# Patient Record
Sex: Female | Born: 1975 | Race: White | Hispanic: No | Marital: Single | Smoking: Former smoker
Health system: Southern US, Community
[De-identification: ages and names within clinical notes are randomized; demographics above are authoritative.]

## PROBLEM LIST (undated history)

## (undated) DIAGNOSIS — F419 Anxiety disorder, unspecified: Secondary | ICD-10-CM

## (undated) DIAGNOSIS — R971 Elevated cancer antigen 125 [CA 125]: Secondary | ICD-10-CM

## (undated) DIAGNOSIS — B009 Herpesviral infection, unspecified: Secondary | ICD-10-CM

## (undated) DIAGNOSIS — I2699 Other pulmonary embolism without acute cor pulmonale: Secondary | ICD-10-CM

## (undated) DIAGNOSIS — J45909 Unspecified asthma, uncomplicated: Secondary | ICD-10-CM

## (undated) DIAGNOSIS — R87629 Unspecified abnormal cytological findings in specimens from vagina: Secondary | ICD-10-CM

## (undated) DIAGNOSIS — N939 Abnormal uterine and vaginal bleeding, unspecified: Secondary | ICD-10-CM

## (undated) DIAGNOSIS — N941 Unspecified dyspareunia: Secondary | ICD-10-CM

## (undated) HISTORY — PX: COSMETIC SURGERY: SHX468

## (undated) HISTORY — PX: OTHER SURGICAL HISTORY: SHX169

---

## 1998-02-03 ENCOUNTER — Other Ambulatory Visit: Admission: RE | Admit: 1998-02-03 | Discharge: 1998-02-03 | Payer: Self-pay | Admitting: Obstetrics & Gynecology

## 1998-06-18 ENCOUNTER — Other Ambulatory Visit: Admission: RE | Admit: 1998-06-18 | Discharge: 1998-06-18 | Payer: Self-pay | Admitting: Obstetrics & Gynecology

## 1998-09-24 ENCOUNTER — Other Ambulatory Visit: Admission: RE | Admit: 1998-09-24 | Discharge: 1998-09-24 | Payer: Self-pay | Admitting: Obstetrics & Gynecology

## 1999-05-17 ENCOUNTER — Other Ambulatory Visit: Admission: RE | Admit: 1999-05-17 | Discharge: 1999-05-17 | Payer: Self-pay | Admitting: Obstetrics & Gynecology

## 2000-06-15 ENCOUNTER — Other Ambulatory Visit: Admission: RE | Admit: 2000-06-15 | Discharge: 2000-06-15 | Payer: Self-pay | Admitting: Obstetrics & Gynecology

## 2000-10-07 ENCOUNTER — Inpatient Hospital Stay (HOSPITAL_COMMUNITY): Admission: AD | Admit: 2000-10-07 | Discharge: 2000-10-07 | Payer: Self-pay | Admitting: Obstetrics and Gynecology

## 2000-10-09 ENCOUNTER — Encounter (INDEPENDENT_AMBULATORY_CARE_PROVIDER_SITE_OTHER): Payer: Self-pay

## 2000-10-09 ENCOUNTER — Ambulatory Visit (HOSPITAL_COMMUNITY): Admission: EM | Admit: 2000-10-09 | Discharge: 2000-10-09 | Payer: Self-pay | Admitting: Obstetrics & Gynecology

## 2001-01-25 ENCOUNTER — Ambulatory Visit (HOSPITAL_COMMUNITY): Admission: RE | Admit: 2001-01-25 | Discharge: 2001-01-25 | Payer: Self-pay | Admitting: Obstetrics & Gynecology

## 2001-01-25 ENCOUNTER — Encounter (INDEPENDENT_AMBULATORY_CARE_PROVIDER_SITE_OTHER): Payer: Self-pay

## 2001-10-04 ENCOUNTER — Other Ambulatory Visit: Admission: RE | Admit: 2001-10-04 | Discharge: 2001-10-04 | Payer: Self-pay | Admitting: Obstetrics & Gynecology

## 2002-04-21 ENCOUNTER — Inpatient Hospital Stay (HOSPITAL_COMMUNITY): Admission: AD | Admit: 2002-04-21 | Discharge: 2002-04-21 | Payer: Self-pay | Admitting: Obstetrics & Gynecology

## 2002-04-22 ENCOUNTER — Inpatient Hospital Stay (HOSPITAL_COMMUNITY): Admission: AD | Admit: 2002-04-22 | Discharge: 2002-04-25 | Payer: Self-pay | Admitting: Obstetrics and Gynecology

## 2002-04-24 ENCOUNTER — Encounter: Payer: Self-pay | Admitting: Obstetrics & Gynecology

## 2002-05-26 ENCOUNTER — Inpatient Hospital Stay (HOSPITAL_COMMUNITY): Admission: AD | Admit: 2002-05-26 | Discharge: 2002-05-29 | Payer: Self-pay | Admitting: Obstetrics & Gynecology

## 2003-03-31 ENCOUNTER — Other Ambulatory Visit: Admission: RE | Admit: 2003-03-31 | Discharge: 2003-03-31 | Payer: Self-pay | Admitting: Obstetrics & Gynecology

## 2004-04-13 ENCOUNTER — Other Ambulatory Visit: Admission: RE | Admit: 2004-04-13 | Discharge: 2004-04-13 | Payer: Self-pay | Admitting: Obstetrics & Gynecology

## 2005-05-16 ENCOUNTER — Other Ambulatory Visit: Admission: RE | Admit: 2005-05-16 | Discharge: 2005-05-16 | Payer: Self-pay | Admitting: Obstetrics & Gynecology

## 2008-09-16 ENCOUNTER — Ambulatory Visit: Payer: Self-pay | Admitting: Family Medicine

## 2008-09-16 DIAGNOSIS — J189 Pneumonia, unspecified organism: Secondary | ICD-10-CM | POA: Insufficient documentation

## 2008-09-16 DIAGNOSIS — R0602 Shortness of breath: Secondary | ICD-10-CM | POA: Insufficient documentation

## 2008-09-16 LAB — CONVERTED CEMR LAB: Hemoglobin: 14.2 g/dL

## 2008-09-24 ENCOUNTER — Ambulatory Visit: Payer: Self-pay | Admitting: Family Medicine

## 2008-09-24 ENCOUNTER — Telehealth: Payer: Self-pay | Admitting: Family Medicine

## 2008-09-24 DIAGNOSIS — J018 Other acute sinusitis: Secondary | ICD-10-CM | POA: Insufficient documentation

## 2008-09-25 ENCOUNTER — Encounter: Payer: Self-pay | Admitting: Family Medicine

## 2008-09-25 LAB — CONVERTED CEMR LAB
Basophils Absolute: 0.1 10*3/uL (ref 0.0–0.1)
Basophils Relative: 1 % (ref 0–1)
Eosinophils Absolute: 0.1 10*3/uL (ref 0.0–0.7)
Eosinophils Relative: 2 % (ref 0–5)
HCT: 40.6 % (ref 36.0–46.0)
Hemoglobin: 14 g/dL (ref 12.0–15.0)
Lymphocytes Relative: 25 % (ref 12–46)
Lymphs Abs: 1.9 10*3/uL (ref 0.7–4.0)
MCHC: 34.5 g/dL (ref 30.0–36.0)
MCV: 90.2 fL (ref 78.0–100.0)
Monocytes Absolute: 0.9 10*3/uL (ref 0.1–1.0)
Monocytes Relative: 11 % (ref 3–12)
Neutro Abs: 4.7 10*3/uL (ref 1.7–7.7)
Neutrophils Relative %: 62 % (ref 43–77)
Platelets: 214 10*3/uL (ref 150–400)
RBC: 4.5 M/uL (ref 3.87–5.11)
RDW: 13.1 % (ref 11.5–15.5)
WBC: 7.6 10*3/uL (ref 4.0–10.5)

## 2008-10-29 ENCOUNTER — Encounter: Payer: Self-pay | Admitting: Family Medicine

## 2008-10-29 DIAGNOSIS — I2699 Other pulmonary embolism without acute cor pulmonale: Secondary | ICD-10-CM | POA: Insufficient documentation

## 2008-10-31 ENCOUNTER — Encounter: Payer: Self-pay | Admitting: Family Medicine

## 2008-11-01 ENCOUNTER — Encounter: Payer: Self-pay | Admitting: Family Medicine

## 2008-11-02 ENCOUNTER — Encounter: Payer: Self-pay | Admitting: Family Medicine

## 2008-11-02 ENCOUNTER — Telehealth (INDEPENDENT_AMBULATORY_CARE_PROVIDER_SITE_OTHER): Payer: Self-pay | Admitting: *Deleted

## 2008-11-04 ENCOUNTER — Ambulatory Visit: Payer: Self-pay | Admitting: Family Medicine

## 2008-11-04 DIAGNOSIS — R091 Pleurisy: Secondary | ICD-10-CM | POA: Insufficient documentation

## 2008-11-04 LAB — CONVERTED CEMR LAB
INR: 2.2
Prothrombin Time: 18.2 s

## 2008-11-10 ENCOUNTER — Ambulatory Visit: Payer: Self-pay | Admitting: Family Medicine

## 2008-11-10 LAB — CONVERTED CEMR LAB
INR: 2.2
Prothrombin Time: 18.1 s

## 2008-11-11 ENCOUNTER — Ambulatory Visit: Payer: Self-pay | Admitting: Family Medicine

## 2008-11-12 ENCOUNTER — Telehealth: Payer: Self-pay | Admitting: Family Medicine

## 2008-11-24 ENCOUNTER — Ambulatory Visit: Payer: Self-pay | Admitting: Family Medicine

## 2008-11-24 LAB — CONVERTED CEMR LAB
INR: 2
Prothrombin Time: 17.5 s

## 2008-11-25 ENCOUNTER — Telehealth: Payer: Self-pay | Admitting: Family Medicine

## 2008-11-25 ENCOUNTER — Encounter: Payer: Self-pay | Admitting: Family Medicine

## 2008-12-08 ENCOUNTER — Encounter: Payer: Self-pay | Admitting: Family Medicine

## 2008-12-08 ENCOUNTER — Ambulatory Visit: Payer: Self-pay | Admitting: Family Medicine

## 2008-12-08 DIAGNOSIS — R5383 Other fatigue: Secondary | ICD-10-CM

## 2008-12-08 DIAGNOSIS — R209 Unspecified disturbances of skin sensation: Secondary | ICD-10-CM | POA: Insufficient documentation

## 2008-12-08 DIAGNOSIS — R5381 Other malaise: Secondary | ICD-10-CM | POA: Insufficient documentation

## 2008-12-08 LAB — CONVERTED CEMR LAB
INR: 2.1
INR: 2.1 — ABNORMAL HIGH (ref 0.0–1.5)
Prothrombin Time: 18.3 s — ABNORMAL HIGH (ref 10.2–14.0)

## 2008-12-09 LAB — CONVERTED CEMR LAB
HCT: 42.2 % (ref 36.0–46.0)
Hemoglobin: 14 g/dL (ref 12.0–15.0)
Iron: 119 ug/dL (ref 42–145)
MCHC: 33.2 g/dL (ref 30.0–36.0)
MCV: 89.6 fL (ref 78.0–100.0)
Platelets: 221 10*3/uL (ref 150–400)
RBC: 4.71 M/uL (ref 3.87–5.11)
RDW: 12.8 % (ref 11.5–15.5)
Saturation Ratios: 30 % (ref 20–55)
TIBC: 391 ug/dL (ref 250–470)
TSH: 0.969 microintl units/mL (ref 0.350–4.50)
UIBC: 272 ug/dL
WBC: 7.3 10*3/uL (ref 4.0–10.5)

## 2008-12-21 ENCOUNTER — Ambulatory Visit: Payer: Self-pay | Admitting: Family Medicine

## 2008-12-21 DIAGNOSIS — J0100 Acute maxillary sinusitis, unspecified: Secondary | ICD-10-CM | POA: Insufficient documentation

## 2008-12-22 ENCOUNTER — Telehealth: Payer: Self-pay | Admitting: Family Medicine

## 2009-01-01 ENCOUNTER — Ambulatory Visit: Payer: Self-pay | Admitting: Family Medicine

## 2009-01-01 LAB — CONVERTED CEMR LAB
INR: 1.5
Prothrombin Time: 15.2 s

## 2009-01-14 ENCOUNTER — Ambulatory Visit: Payer: Self-pay | Admitting: Family Medicine

## 2009-01-14 LAB — CONVERTED CEMR LAB
INR: 1.7
Prothrombin Time: 15.9 s

## 2009-01-21 ENCOUNTER — Ambulatory Visit: Payer: Self-pay | Admitting: Family Medicine

## 2009-01-21 ENCOUNTER — Encounter: Admission: RE | Admit: 2009-01-21 | Discharge: 2009-01-21 | Payer: Self-pay | Admitting: Family Medicine

## 2009-01-21 LAB — CONVERTED CEMR LAB
INR: 1.9
Prothrombin Time: 16.8 s

## 2009-01-27 ENCOUNTER — Ambulatory Visit: Payer: Self-pay | Admitting: Critical Care Medicine

## 2009-01-31 ENCOUNTER — Encounter: Payer: Self-pay | Admitting: Critical Care Medicine

## 2009-02-11 ENCOUNTER — Ambulatory Visit: Payer: Self-pay | Admitting: Family Medicine

## 2009-02-11 LAB — CONVERTED CEMR LAB
INR: 2.1
Prothrombin Time: 17.8 s

## 2009-03-11 ENCOUNTER — Ambulatory Visit: Payer: Self-pay | Admitting: Family Medicine

## 2009-03-11 LAB — CONVERTED CEMR LAB
INR: 1.8
Prothrombin Time: 16.4 s

## 2009-04-08 ENCOUNTER — Ambulatory Visit: Payer: Self-pay | Admitting: Family Medicine

## 2009-04-08 LAB — CONVERTED CEMR LAB
INR: 1.5
Prothrombin Time: 15.2 s

## 2009-04-19 ENCOUNTER — Ambulatory Visit: Payer: Self-pay | Admitting: Family Medicine

## 2009-05-05 ENCOUNTER — Ambulatory Visit: Payer: Self-pay | Admitting: Critical Care Medicine

## 2009-05-06 ENCOUNTER — Ambulatory Visit: Payer: Self-pay | Admitting: Family Medicine

## 2009-05-06 LAB — CONVERTED CEMR LAB
INR: 1.3
Prothrombin Time: 13.4 s

## 2009-05-07 ENCOUNTER — Encounter: Payer: Self-pay | Admitting: Critical Care Medicine

## 2009-05-17 ENCOUNTER — Ambulatory Visit: Payer: Self-pay | Admitting: Family Medicine

## 2009-05-17 LAB — CONVERTED CEMR LAB
INR: 2
Prothrombin Time: 14.2 s

## 2009-06-18 ENCOUNTER — Ambulatory Visit: Payer: Self-pay | Admitting: Family Medicine

## 2009-06-18 LAB — CONVERTED CEMR LAB
INR: 3.1
INR: 3.1 — ABNORMAL HIGH (ref 0.0–1.5)
Prothrombin Time: 24.2 s
Prothrombin Time: 24.2 s — ABNORMAL HIGH (ref 10.2–14.0)

## 2009-07-21 ENCOUNTER — Encounter (INDEPENDENT_AMBULATORY_CARE_PROVIDER_SITE_OTHER): Payer: Self-pay | Admitting: *Deleted

## 2009-07-22 ENCOUNTER — Encounter: Payer: Self-pay | Admitting: Family Medicine

## 2009-07-23 LAB — CONVERTED CEMR LAB
INR: 1.6
INR: 1.6 — ABNORMAL HIGH (ref 0.0–1.5)
Prothrombin Time: 16 s
Prothrombin Time: 16 s — ABNORMAL HIGH (ref 10.2–14.0)

## 2009-07-27 ENCOUNTER — Ambulatory Visit: Payer: Self-pay | Admitting: Family Medicine

## 2009-07-27 DIAGNOSIS — J309 Allergic rhinitis, unspecified: Secondary | ICD-10-CM | POA: Insufficient documentation

## 2009-08-12 ENCOUNTER — Ambulatory Visit: Payer: Self-pay | Admitting: Critical Care Medicine

## 2009-08-18 LAB — CONVERTED CEMR LAB
AntiThromb III Func: 111 % (ref 76–126)
Anticardiolipin IgA: <3 (ref <10)
Anticardiolipin IgG: 6 (ref <10)
Anticardiolipin IgM: 4 (ref <10)
Homocysteine: 5.5 micromoles/L (ref 4.0–15.4)
Protein S Ag, Total: 74 % (ref 70–140)

## 2009-11-02 ENCOUNTER — Telehealth: Payer: Self-pay | Admitting: Family Medicine

## 2010-11-02 ENCOUNTER — Encounter: Payer: Self-pay | Admitting: Family Medicine

## 2010-11-02 ENCOUNTER — Ambulatory Visit
Admission: RE | Admit: 2010-11-02 | Discharge: 2010-11-02 | Payer: Self-pay | Source: Home / Self Care | Attending: Family Medicine | Admitting: Family Medicine

## 2010-11-02 DIAGNOSIS — J069 Acute upper respiratory infection, unspecified: Secondary | ICD-10-CM | POA: Insufficient documentation

## 2010-11-02 LAB — CONVERTED CEMR LAB
HCT: 40.7 % (ref 36.0–46.0)
Hemoglobin: 14.1 g/dL (ref 12.0–15.0)
Lymphocytes Relative: 27 % (ref 12–46)
Lymphs Abs: 2.4 10*3/uL (ref 0.7–4.0)
MCHC: 34.7 g/dL (ref 30.0–36.0)
MCV: 92.8 fL (ref 78.0–100.0)
Monocytes Absolute: 0.5 10*3/uL (ref 0.1–1.0)
Monocytes Relative: 6 % (ref 3–12)
Neutro Abs: 5.9 10*3/uL (ref 1.7–7.7)
Neutrophils Relative %: 67 % (ref 43–77)
Platelets: 213 10*3/uL (ref 150–400)
RBC: 4.39 M/uL (ref 3.87–5.11)
RDW: 12.5 % (ref 11.5–15.5)
WBC: 8.8 10*3/uL (ref 4.0–10.5)

## 2010-11-08 NOTE — Letter (Signed)
Summary: Out of Work  [facility]  [address], [address]   [place], [place] [address]   Phone: [phone]  Fax: [phone]    November 04, 2008   Employee:  MAYUMI SUMMERSON    To Whom It May Concern:   For Medical reasons, please excuse the above named employee from work for the following dates:  Start:   Jan 20th, 2010 --> Feb 20th, 2010  End:   Feb 21st, 2010    If you need additional information, please feel free to contact our office.         Sincerely,    Seymour Bars DO

## 2010-11-08 NOTE — Assessment & Plan Note (Signed)
Summary: INR  Nurse Visit   Vitals Entered By: Payton Spark CMA (May 06, 2009 8:36 AM)  Allergies: 1)  ! Pcn Laboratory Results   Blood Tests     PT: 13.4 s   (Normal Range: 10.6-13.4)  INR: 1.3   (Normal Range: 0.88-1.12   Therap INR: 2.0-3.5)    Orders Added: 1)  Fingerstick [36416] 2)  Protime INR [85610] Prescriptions: COUMADIN 6 MG TABS (WARFARIN SODIUM) Sunday - 6 mg, Monday - 6 mg, Tuesday - 7 mg, Wednesday - 6 mg, Thursday - 6 mg, Friday - 7 mg, Saturday - 6 mg  #90 x 0   Entered by:   Michelle Mills CMA   Authorized by:   Karen Bowen DO   Signed by:   Michelle Mills CMA on 05/06/2009   Method used:   Electronically to        [id number]       [address]       [place], [place]  [address]       Ph: [phone] or [phone]       Fax: [phone]   RxID:   [id number] COUMADIN 1 MG TABS (WARFARIN SODIUM) Sunday - 6 mg, Monday - 6 mg, Tuesday - 7 mg, Wednesday - 6 mg, Thursday - 6 mg, Friday - 7 mg, Saturday - 6 mg  #30 x 0   Entered by:   Michelle Mills CMA   Authorized by:   Karen Bowen DO   Signed by:   Michelle Mills CMA on 05/06/2009   Method used:   [facility] to        [id number]       [address]       [place], [place]  [address]       Ph: [phone] or [phone]       Fax: [phone]   RxID:   [id number]    Anticoagulation Management History:      The patient is on coumadin and comes in today for a routine follow up visit.  She is being anticoagulated due to the first episode of deep venous thrombosis and/or pulmonary embolism.  Anticipated length of treatment is 6 months.  Her last INR was 1.5 and today's INR is 1.3.    Anticoagulation Management Assessment/Plan:      The target INR is 2.0-3.0.  She is to have a PT/INR in 2 weeks.  Anticoagulation instructions were given to patient.         Current Anticoagulation Instructions: The patient's dosage of coumadin will be  increased.  The new dosage includes:   Coumadin 6 mg tabs and Coumadin 1 mg tabs:  Sunday - 6 mg, Monday - 8 mg, Tuesday - 6 mg, Wednesday - 8 mg, Thursday - 6 mg, Friday - 8 mg, Saturday - 6 mg.    Repeat PT/INR in 2 weeks.

## 2010-11-08 NOTE — Assessment & Plan Note (Signed)
Summary: sinusitis   Vital Signs:  Patient profile:   35 year old female Weight:      124 pounds O2 Sat:      100 % Temp:     97.7 degrees F oral Pulse rate:   66 / minute BP sitting:   110 / 71  (left arm) Cuff size:   regular  Vitals Entered By: Harlene Salts (December 21, 2008 1:09 PM)  History of Present Illness: c/o thick green congestion,pain in lung area  Taking generic claritin.  Began about 2 wks ago with sneezing.  She has sore throat and post nasal drip.  Just started having dry cough yesteday.  Feels weak and tired.  No energy.  No GI upset.  No fever or chills.  Green/ yellow rhinorrhea.  Also tried Mucinex DM.  Did not help.  Taking Tylenol for HAs.  No hx of allergies.  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  Having increased in pleuritic CP.  Current Medications (verified): 1)  Vitamin C-Acerola 500 Mg Chew (Ascorbic Acid) .... Take 1 Tablet By Mouth Once A Day 2)  Promethazine Hcl 25 Mg Tabs (Promethazine Hcl) .... Take 1 Tablet By Mouth Once A Day As Needed Nausea 3)  Lorazepam 0.5 Mg Tabs (Lorazepam) .Marland Kitchen.. 1 Tab By Mouth Two Times A Day As Needed Anxiety 4)  Coumadin 5 Mg Tabs (Warfarin Sodium) .... Sunday - 5 Mg, Monday - 5 Mg, Tuesday - 5 Mg, Wednesday - 5 Mg, Thursday - 5 Mg, Friday - 5 Mg, Saturday - 5 Mg 5)  Mucinex Dm 30-600 Mg Xr12h-Tab (Dextromethorphan-Guaifenesin) .... Take 1 Tablet By Mouth Once A Day 6)  Claritin 10 Mg Tabs (Loratadine) .... Take 1 Tablet By Mouth Once A Day 7)  Omnicef 300 Mg Caps (Cefdinir) .Marland Kitchen.. 1 Tab By Mouth Q 12 Hrs X 7 Days  Allergies: 1)  ! Pcn  Social History:    Reviewed history from 09/16/2008 and no changes required:       Investment banker, corporate        HS degree.       Divorced and shares custody.       Has 2 daughters 60 & 6       Lives w/ fiance.       Regular exercise.       Has Mirena IUD  Review of Systems      See HPI  Physical Exam  General:  alert, well-developed, well-nourished, and well-hydrated.   Head:   normocephalic and atraumatic.  maxillary sinuses TTP Eyes:  conjunctiva clear Ears:  ears full of soft yellow cerumen Nose:  nasal congestion with boggy turbinates Mouth:  throat injected with clear post nasal drip Neck:  no masses.   Lungs:  Normal respiratory effort, chest expands symmetrically. Lungs are clear to auscultation, no crackles or wheezes. no splinting Heart:  Normal rate and regular rhythm. S1 and S2 normal without gallop, murmur, click, rub or other extra sounds. Skin:  color normal.   Cervical Nodes:  No lymphadenopathy noted   Impression & Recommendations:  Problem # 1:  ACUTE MAXILLARY SINUSITIS (ICD-461.0) 2 wks of illness.  Viral URI vs sinusitis.  Cover with Omnicef x 7 days.  OK to use Mucinex D or DM. Her updated medication list for this problem includes:    Mucinex Dm 30-600 Mg Xr12h-tab (Dextromethorphan-guaifenesin) .Marland Kitchen... Take 1 tablet by mouth once a day    Omnicef 300 Mg Caps (Cefdinir) .Marland Kitchen... 1 tab by mouth q  12 hrs x 7 days  Problem # 2:  PLEURISY (ICD-511.0) Secondary to PE.  Anticoagulants managed here.  INR therapeutic.  Tylenol/ Heat OK.  Pulse ox 100% with a normal lung exam.  Viral URI with cough likely exacerbating her pleurisy.  Complete Medication List: 1)  Vitamin C-acerola 500 Mg Chew (Ascorbic acid) .... Take 1 tablet by mouth once a day 2)  Promethazine Hcl 25 Mg Tabs (Promethazine hcl) .... Take 1 tablet by mouth once a day as needed nausea 3)  Lorazepam 0.5 Mg Tabs (Lorazepam) .Marland Kitchen.. 1 tab by mouth two times a day as needed anxiety 4)  Coumadin 5 Mg Tabs (Warfarin sodium) .... Sunday - 5 mg, monday - 5 mg, tuesday - 5 mg, wednesday - 5 mg, thursday - 5 mg, friday - 5 mg, saturday - 5 mg 5)  Mucinex Dm 30-600 Mg Xr12h-tab (Dextromethorphan-guaifenesin) .... Take 1 tablet by mouth once a day 6)  Claritin 10 Mg Tabs (Loratadine) .... Take 1 tablet by mouth once a day 7)  Omnicef 300 Mg Caps (Cefdinir) .... 1 tab by mouth q 12 hrs x 7  days  Patient Instructions: 1)  OK to add: 2)  Multivitamin daily. 3)  Vitamin C 500 mg daily. 4)  Echinacea daily (optional). 5)  Rapid Strep: negative. 6)  Treat with 7 days of Omnicef for sinusitis. 7)  Safe to use Mucinex D for head congesiton. 8)  The medication list was reviewed and reconciled.  All changed / newly prescribed medications were explained.  A complete medication list was provided to the patient / caregiver. Prescriptions: OMNICEF 300 MG CAPS (CEFDINIR) 1 tab by mouth q 12 hrs x 7 days  #14 x 0   Entered and Authorized by:   Johannes Everage DO   Signed by:   Angellynn Kimberlin DO on 12/21/2008   Method used:   [facility]        [facility]       [address]       [place], [place]  [address]       Ph: [phone] or [phone]       Fax: [phone]   RxID:   [id number]

## 2010-11-08 NOTE — Miscellaneous (Signed)
Summary: PROTIME/INR   Clinical Lists Changes  Orders: Added new Test order of T-Protime, Auto [phone]) - Signed

## 2010-11-08 NOTE — Assessment & Plan Note (Signed)
Summary: tingling   Vital Signs:  Patient Profile:   35 Years Old Female Height:     63.75 inches Weight:      123 pounds BMI:     21.36 O2 Sat:      100 % Pulse rate:   65 / minute BP sitting:   114 / 77  (left arm) Cuff size:   regular  Vitals Entered By: Harlene Salts (December 08, 2008 3:15 PM)                 PCP:  Seymour Bars DO  Chief Complaint:  tingling in fingers.  History of Present Illness: 35 yo WF on coumadin for a post op PE in Jan here for tingling in arms and legs x 5 days along with fatigue.  No Sore throat or other URI symptoms.  No GI upset.  Lack of appetitie.  Has been anxious since having the PE and cont to have R thoracic posterior pleuritic chest pain.  Using Vicodin sparingly.  Back at work and upset that she has not been strong enough to exercise yet.  No bruising, gingival bleeding, vaginal bleeding or rectal bleeding.  No HAs, fevers.  Has felt some chills.    Current Allergies: ! PCN  Past Medical History:    Reviewed history from 11/04/2008 and no changes required:       [id number]       PE 10-2008       Nestor Ramp [facility]  Past Surgical History:    LTCS x 1    breast augmentation/ abdominoplasty 12-09   Social History:    Reviewed history from 09/16/2008 and no changes required:       Investment banker, corporate        HS degree.       Divorced and shares custody.       Has 2 daughters 85 & 6       Lives w/ fiance.       Regular exercise.       Has Mirena IUD    Review of Systems      See HPI   Physical Exam  General:     alert, well-developed, well-nourished, and well-hydrated.   Head:     normocephalic and atraumatic.   Eyes:     R conjunctiva mildly injected nasal side Nose:     no nasal discharge.   Mouth:     good dentition and pharynx pink and moist.   Neck:     no masses.   Lungs:     Normal respiratory effort, chest expands symmetrically. Lungs are clear to auscultation, no crackles or wheezes. Heart:     Normal rate and  regular rhythm. S1 and S2 normal without gallop, murmur, click, rub or other extra sounds. Abdomen:     soft, non-tender, normal bowel sounds, no distention, no masses, no guarding, no hepatomegaly, and no splenomegaly.   Msk:     no joint tenderness, no joint swelling, and no joint warmth.   Pulses:     2+ radial pulses Extremities:     no LE edema Skin:     color normal.   Cervical Nodes:     No lymphadenopathy noted Psych:     flat affect.      Impression & Recommendations:  Problem # 1:  FATIGUE (ICD-780.79) Check labs today, esp for anemia and iron deficiency given use of coumadin.  If labs come back normal, I will treat her  for adjustment disorder with depression and anxiety with SSRI and as needed benzo.   Orders: T-Iron Binding Capacity (TIBC) ([id number]) T-Iron [id number]) T-TSH [id number]) T-CBC No Diff ([id number])   Problem # 2:  PARESTHESIA (ICD-782.0) As per #1.  I do not see any sign of organic cause for tingling in all extremities.  BP stable.  Complete Medication List: 1)  Vitamin C-acerola 500 Mg Chew (Ascorbic acid) .... Take 1 tablet by mouth once a day 2)  Promethazine Hcl 25 Mg Tabs (Promethazine hcl) .... Take 1 tablet by mouth once a day as needed nausea 3)  Lorazepam 1 Mg Tabs (Lorazepam) .... Take 1 tablet by mouth three times a day as needed anxiety 4)  Coumadin 5 Mg Tabs (Warfarin sodium) .... Sunday - 5 mg, monday - 5 mg, tuesday - 5 mg, wednesday - 5 mg, thursday - 5 mg, friday - 5 mg, saturday - 5 mg 5)  Citalopram Hydrobromide 10 Mg Tabs (Citalopram hydrobromide) .Marland Kitchen.. 1 tab by mouth daily    Prescriptions: CITALOPRAM HYDROBROMIDE 10 MG TABS (CITALOPRAM HYDROBROMIDE) 1 tab by mouth daily  #30 x 1   Entered and Authorized by:   Seymour Bars DO   Signed by:   Seymour Bars DO on 12/08/2008   Method used:   Print then Give to Patient   RxID:   [id number]

## 2010-11-08 NOTE — Assessment & Plan Note (Signed)
Summary: PT/INR  Nurse Visit   Vitals Entered By: Payton Spark CMA (March 11, 2009 2:58 PM)    Allergies: 1)  ! Pcn  Laboratory Results   Blood Tests     PT: 16.4 s   (Normal Range: 10.6-13.4)  INR: 1.8   (Normal Range: 0.88-1.12   Therap INR: 2.0-3.5)      Orders Added: 1)  Fingerstick [36416] 2)  Protime INR [[id number]]      Anticoagulation Management History:      The patient is on coumadin and comes in today for a routine follow up visit.  The patient is on Coumadin for the first episode of deep venous thrombosis and/or pulmonary embolism.  Anticipated length of treatment is 6 months.  Her last INR was 2.1 and today's INR is 1.8.    Anticoagulation Management Assessment/Plan:      The target INR is 2.0-3.0.  She is to have a PT/INR in 4 weeks.  Anticoagulation instructions were given to patient.         Current Anticoagulation Instructions: The patient's dosage of coumadin will be increased.  The new dosage includes:   Coumadin 5 mg tabs and Coumadin 1 mg tabs:  Sunday - 6 mg, Monday - 6 mg, Tuesday - 6 mg, Wednesday - 6 mg, Thursday - 6 mg, Friday - 6 mg, Saturday - 6 mg.    Repeat PT/INR in 4 weeks.

## 2010-11-08 NOTE — Progress Notes (Signed)
Summary: CK COUMIDIN LEVEL, LAB OR OV  Phone Note Call from Patient Call back at Home Phone [phone]   Caller: Patient Call For: Seymour Bars DO Reason for Call: Talk to Doctor Summary of Call: PT NEEDS TO HAVE COUMADIN LEVEL CHECKED. CAN SHE JUST GO TO THE LAB TODAY AND SCH A HOSP FU FOR TOMORROW. PT SAYS SHE NEEDS IT DONE TODAY. SHE WAS @ [facility] AND THEY LET HER GO HME WITH HER LEVEL NOT CORRECT.  Initial call taken by: Sarina Ill,  November 02, 2008 9:04 AM  Follow-up for Phone Call        have her come in for a nurse visit PT/INR today Follow-up by: Seymour Bars DO,  November 02, 2008 9:43 AM  Additional Follow-up for Phone Call Additional follow up Details #1::        PT CALLED.  Additional Follow-up by: Sarina Ill,  November 02, 2008 9:46 AM

## 2010-11-08 NOTE — Miscellaneous (Signed)
Summary: CT chest 11/12/08  Clinical Lists Changes  Observations: Added new observation of CT OF CHEST: Resolved PE,  RLL infarct  (11/12/2008 14:46)      CT of Chest  Procedure date:  11/12/2008  Findings:      Resolved PE,  RLL infarct

## 2010-11-08 NOTE — Progress Notes (Signed)
Summary: NEED NOTE FAXED TO JOB  Phone Note Call from Patient Call back at Home Phone [phone]   Caller: Patient Call For: Seymour Bars DO Summary of Call: need note faxed to job [phone] R/E her return to work. Initial call taken by: Harlene Salts,  November 25, 2008 8:17 AM

## 2010-11-08 NOTE — Miscellaneous (Signed)
Summary: Orders Update  Clinical Lists Changes  Orders: Added new Test order of T-Protime, Auto ([id number]) - Signed 

## 2010-11-08 NOTE — Assessment & Plan Note (Signed)
Summary: HFU: PE   Vital Signs:  Patient Profile:   35 Years Old Female Height:     63.75 inches Weight:      124 pounds BMI:     21.53 O2 Sat:      100 % Temp:     97.3 degrees F oral Pulse rate:   71 / minute BP sitting:   102 / 68  (left arm) Cuff size:   regular  Vitals Entered By: Harlene Salts (November 04, 2008 10:04 AM)                 PCP:  Seymour Bars DO  Chief Complaint:  hospital follow-up.  History of Present Illness: 35 yo WF presents for HFU for PE.  She had recently had pneumonia and plastic surgery.  She is due for INR today and has been using Lovenox and coumadin in the evening.     She was given a h/o on Vit K foods.  She has a Mirena IUD.    She still feels SOB and has some R lower sided pleurisy.  Using Vicodin to assist her in deep breathing.  Has a large R flank hematoma from Lovenox injection and is still wearing abdominal binder for her abdominoplasty.  She was seeing Dr Thea Silversmith at [facility] and had bloodwork done on Monday.  She needs to have FMLA papers filled out.    Anticoagulation Management History:      The patient comes in today for her initial visit for anticoagulation therapy.  Anticoagulation is being administered due to the first episode of deep venous thrombosis and/or pulmonary embolism.  Anticipated length of treatment is 6 months.  Today's INR is 2.2.       Current Allergies: ! PCN  Past Medical History:    [id number]    PE 10-2008    [facility]or Ramp OB   Family History:    Reviewed history from 09/16/2008 and no changes required:       mother Wilson's Dz, HTN       sister HTN       father ETOH  Social History:    Reviewed history from 09/16/2008 and no changes required:       Investment banker, corporate        HS degree.       Divorced and shares custody.       Has 2 daughters 59 & 6       Lives w/ fiance.       Regular exercise.       Has Mirena IUD    Review of Systems  General      Complains of fatigue, loss of appetite, and  weakness.      Denies chills, fever, sleep disorder, and sweats.  CV      Complains of chest pain or discomfort and shortness of breath with exertion.      Denies lightheadness, near fainting, palpitations, and swelling of feet.  Resp      Complains of chest pain with inspiration, pleuritic, and shortness of breath.      Denies cough and wheezing.  GI      Denies bloody stools and constipation.   Physical Exam  General:     alert, well-developed, well-nourished, and well-hydrated.   Mouth:     pharynx pink and moist.   Neck:     no masses.   Chest Wall:     R anterior chest wall TTP Lungs:     splinting,  no tachypnea no accessory muscle use, normal breath sounds, and no wheezes.   Heart:     normal rate, regular rhythm, and no murmur.   Abdomen:     R flank hematoma, tennis ball sized, slightly hard and purplish yellow in color, tender  abdominal banding in place Skin:     color normal.   Cervical Nodes:     No lymphadenopathy noted Psych:     good eye contact, not anxious appearing, and not depressed appearing.      Impression & Recommendations:  Problem # 1:  PULMONARY EMBOLISM (ICD-415.19) Assessment: Unchanged Reviewed hospital notes.  PE felt to be secondary to recent abdominoplasty.  INR at goal (2-3) at 2.2 today.  Stop Lovenox and treat Lovenox injection site hematoma with heat.  Use Vicodin for pain and encourage deep breathing exercises to avoid splinting and dev of a secondary pneumonia.  Expect her to be out of work for 1 month.  Needs to bring in FMLA papers.  Recheck INR Monday.  Confirm home dose of coumadin.  Should not need hypercoag w/u since this was post op. Her updated medication list for this problem includes:    Coumadin 5 Mg Tabs (Warfarin sodium) .Marland Kitchen... Take 1 tablet by mouth once a day  Orders: Fingerstick ([address]) Protime INR (60454)   Problem # 2:  PLEURISY (ICD-511.0) Secondary to #1.  Vicodin for pain, given at discharge.  No  NSAIDs (unless we try Celebrex) due to bleeding risk.    Complete Medication List: 1)  Coumadin 5 Mg Tabs (Warfarin sodium) .... Take 1 tablet by mouth once a day 2)  Vitamin C-acerola 500 Mg Chew (Ascorbic acid) .... Take 1 tablet by mouth once a day 3)  Promethazine Hcl 25 Mg Tabs (Promethazine hcl) .... Take 1 tablet by mouth once a day as needed nausea 4)  Ceftin 500 Mg Tabs (Cefuroxime axetil)  Anticoagulation Management Assessment/Plan:      The target INR is 2.0-3.0.     Patient Instructions: 1)  STOP LOVENOX. 2)  HEATING PAD TO R SIDED HEMATOMA. 3)  DEEP BREATHING EXERCISES EVERY HOUR. 4)  CALL WITH COUMADIN DOSE, STAY ON CURRENT DOSE. 5)  I WILL GET YOUR RECORDS. 6)  BRING IN FMLA PAPERS. 7)  RETURN FOR PT/INR IN 1 WK.    Laboratory Results   Blood Tests   Date/Time Recieved: November 04, 2008 10:34 AM  Date/Time Reported: November 04, 2008 10:34 AM   PT: 18.2 s   (Normal Range: 10.6-13.4)  INR: 2.2   (Normal Range: 0.88-1.12   Therap INR: 2.0-3.5)

## 2010-11-08 NOTE — Progress Notes (Signed)
Summary: NEED WORK LETTER CHANGED  Phone Note Call from Patient Call back at Home Phone [phone]   Caller: Patient Call For: Seymour Bars DO Summary of Call: patient requesting that her letter includes that she can work this week 1/2 days. fax [phone]. Initial call taken by: Harlene Salts,  November 25, 2008 1:40 PM

## 2010-11-08 NOTE — Progress Notes (Signed)
Summary: DIFFICULTY BREATHING  Phone Note Call from Patient Call back at [phone]   Caller: Spouse Call For: nurse Summary of Call: received call from spouse that patient was having difficulty breathing. Nurse informed him that if patient was having difficulty breathing that she needed to go to ED.   Initial call taken by: Harlene Salts,  November 12, 2008 10:09 AM  Follow-up for Phone Call        agree! Follow-up by: Seymour Bars DO,  November 12, 2008 10:32 AM

## 2010-11-08 NOTE — Miscellaneous (Signed)
Summary: PE  Clinical Lists Changes  Problems: Added new problem of PULMONARY EMBOLISM (ICD-415.19) Observations: Added new observation of PRIMARY MD: Seymour Bars DO (10/29/2008 13:22)       Impression & Recommendations:  Problem # 1:  PULMONARY EMBOLISM (ICD-415.19) s/p tummy tuck.  Admitted to [facility] 10/29/07 and placed on Lovenox and Coumadin.  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  Complete Medication List: 1)  Chest Congestion/cough Relief 20-400 Mg Tabs (Dextromethorphan-guaifenesin) .... Take 1 tablet by mouth two times a day 2)  Hydrocodone-homatropine 5-1.5 Mg/62ml Syrp (Hydrocodone-homatropine) .Marland Kitchen.. 1 tsp by mouth at bedtime as needed cough 3)  Proair Hfa 108 (90 Base) Mcg/act Aers (Albuterol sulfate) .... 2 inhalations q 4-6 hrs prn 4)  Zithromax Z-pak 250 Mg Tabs (Azithromycin) .... 2 tabs by mouth x 1 day then 1 tab by mouth daily x 4 days

## 2010-11-08 NOTE — Miscellaneous (Signed)
Summary: PFTs  Clinical Lists Changes  Observations: Added new observation of PFT COMMENTS: Normal pulmonary function studies (05/07/2009 10:08) Added new observation of PFT RSLT: normal (05/07/2009 10:08) Added new observation of DLCO/VA%EXP: 101 % (05/07/2009 10:08) Added new observation of DLCO/VA: 4.50 % (05/07/2009 10:08) Added new observation of DLCO % EXPEC: 103 % (05/07/2009 10:08) Added new observation of DLCO: 22.1 % (05/07/2009 10:08) Added new observation of RV % EXPECT: 73 % (05/07/2009 10:08) Added new observation of RV: 1.17 L (05/07/2009 10:08) Added new observation of TLC % EXPECT: 109 % (05/07/2009 10:08) Added new observation of TLC: 5.24 L (05/07/2009 10:08) Added new observation of FEF2575%EXPS: 117 % (05/07/2009 10:08) Added new observation of PSTFEF25/75P: 3.43  (05/07/2009 10:08) Added new observation of PSTFEF25/75%: 4.02 L/min (05/07/2009 10:08) Added new observation of FEV1FVCPRDPS: 79 % (05/07/2009 10:08) Added new observation of PSTFEV1/FVC: 87 % (05/07/2009 10:08) Added new observation of POSTFEV1%PRD: 119 % (05/07/2009 10:08) Added new observation of FEV1PRDPST: 2.96 L (05/07/2009 10:08) Added new observation of POST FEV1: 3.52 L/min (05/07/2009 10:08) Added new observation of POST FVC%EXP: 108 % (05/07/2009 10:08) Added new observation of FVCPRDPST: 3.74 L/min (05/07/2009 10:08) Added new observation of POST FVC: 4.04 L (05/07/2009 10:08) Added new observation of FEF % EXPEC: 95 % (05/07/2009 10:08) Added new observation of FEF25-75%PRE: 3.43 L/min (05/07/2009 10:08) Added new observation of FEF 25-75%: 3.26 L/min (05/07/2009 10:08) Added new observation of FEV1/FVC PRE: 79 % (05/07/2009 10:08) Added new observation of FEV1/FVC: 81 % (05/07/2009 10:08) Added new observation of FEV1 % EXP: 111 % (05/07/2009 10:08) Added new observation of FEV1 PREDICT: 2.96 L (05/07/2009 10:08) Added new observation of FEV1: 3.29 L (05/07/2009 10:08) Added new  observation of FVC % EXPECT: 109 % (05/07/2009 10:08) Added new observation of FVC PREDICT: 3.74 L (05/07/2009 10:08) Added new observation of FVC: 4.07 L (05/07/2009 10:08) Added new observation of PFT DATE: 05/05/2009  (05/07/2009 10:08)      Pulmonary Function Test Date: 05/05/2009 Height (in.): 64 Gender: Female  Pre-Spirometry FVC    Value: 4.07 L/min   Pred: 3.74 L/min     % Pred: 109 % FEV1    Value: 3.29 L     Pred: 2.96 L     % Pred: 111 % FEV1/FVC  Value: 81 %     Pred: 79 %    FEF 25-75  Value: 3.26 L/min   Pred: 3.43 L/min     % Pred: 95 %  Post-Spirometry FVC    Value: 4.04 L/min   Pred: 3.74 L/min     % Pred: 108 % FEV1    Value: 3.52 L     Pred: 2.96 L     % Pred: 119 % FEV1/FVC  Value: 87 %     Pred: 79 %    FEF 25-75  Value: 4.02 L/min   Pred: 3.43 L/min     % Pred: 117 %  Lung Volumes TLC    Value: 5.24 L   % Pred: 109 % RV    Value: 1.17 L   % Pred: 73 % DLCO    Value: 22.1 %   % Pred: 103 % DLCO/VA  Value: 4.50 %   % Pred: 101 %  Comments: Normal pulmonary function studies  Evaluation: normal  Appended Document: PFTs result noted  patient aware

## 2010-11-08 NOTE — Assessment & Plan Note (Signed)
Summary: Pulmonary Consultation   Primary Provider/Referring Provider:  Seymour Bars DO  CC:  Pulmonary consult for f/u  PE and right lung.  Pt c/o pain in her back..  History of Present Illness: Pulmonary Consultation  This is a 35yo WF had acute PE RUL 1/10.  Hosp at [facility] for one week and d/c on coumadin.  Pain was better then worse for past month.  4/15 CXR ok.  Went to ED 4/17  CT scan was neg for resid or new PE but RUL infarct resolving seen.  Had pain in chest with PE prior to dx.  Also PNA and rx with ABX.  Pain is R side, sharp, stabbing.  Not able to run but can walk.  PE related to postop state.  Had Abdominoplasty late 12/09 and was recovering from same when had the PE.   No cough,  and is dyspneic with exertio.  Not able to get a deep breath.  No cigarettes.  Hydrocodone and tylenol help the pain.  No LE edema or pain.  No mucous or cough.  No hemoptysis.  No fever. No heartburn or indigestion.  Here for further eval.  Preventive Screening-Counseling & Management     Smoking Status: never  Current Medications (verified): 1)  Vitamin C-Acerola 500 Mg Chew (Ascorbic Acid) .... Take 1 Tablet By Mouth Once A Day 2)  Claritin 10 Mg Tabs (Loratadine) .... Take 1 Tablet By Mouth Once A Day 3)  Coumadin 5 Mg Tabs (Warfarin Sodium) .... Sunday - 6 Mg, Monday - 6 Mg, Tuesday - 6 Mg, Wednesday - 5 Mg, Thursday - 6 Mg, Friday - 6 Mg, Saturday - 5 Mg 4)  Coumadin 1 Mg Tabs (Warfarin Sodium) .... Sunday - 6 Mg, Monday - 6 Mg, Tuesday - 6 Mg, Wednesday - 5 Mg, Thursday - 6 Mg, Friday - 6 Mg, Saturday - 5 Mg 5)  Hydrocodone-Acetaminophen 5-500 Mg Tabs (Hydrocodone-Acetaminophen) .... As Needed Pain 6)  Tylenol Extra Strength 500 Mg Tabs (Acetaminophen) .... 2-3 Daily  Allergies (verified): 1)  ! Pcn  Past History:  Past Medical History:    [id number]    PE 10/26/2008       -neg DVT study of legs with PE    [facility]  Past Surgical History:    LTCS x 1    breast  augmentation/ abdominoplasty 10/18/08  Family History:    Reviewed history from 09/16/2008 and no changes required:       mother Wilson's Dz, HTN       sister HTN       father ETOH       No clotting disorders in family       Lung CA PGF       COPD MGM  Social History:    Reviewed history from 09/16/2008 and no changes required:       Investment banker, corporate        HS degree.       Divorced and shares custody.       Has 2 daughters 64 & 6       Lives w/ fiance.       Regular exercise.       Has Mirena IUD    Smoking Status:  never  Review of Systems       The patient complains of shortness of breath with activity, chest pain, and headaches.  The patient denies shortness of breath at rest, productive cough, non-productive cough, coughing up blood,  irregular heartbeats, acid heartburn, indigestion, loss of appetite, weight change, abdominal pain, difficulty swallowing, sore throat, tooth/dental problems, nasal congestion/difficulty breathing through nose, sneezing, itching, ear ache, anxiety, depression, hand/feet swelling, joint stiffness or pain, rash, change in color of mucus, and fever.    Vital Signs:  Patient profile:   35 year old female Height:      64 inches (162.56 cm) Weight:      123 pounds (55.91 kg) BMI:     21.19 O2 Sat:      96 % Temp:     97.9 degrees F (36.61 degrees C) oral Pulse rate:   58 / minute BP sitting:   110 / 70  (left arm) Cuff size:   regular  Vitals Entered By: Michel Bickers CMA (January 27, 2009 11:27 AM)  O2 Sat at Rest %:  96 O2 Flow:  room air  Physical Exam  Additional Exam:  Gen: Pleasant, well-nourished, in no distress,  normal affect ENT: No lesions,  mouth clear,  oropharynx clear, no postnasal drip Neck: No JVD, no TMG, no carotid bruits Lungs: No use of accessory muscles, no dullness to percussion, clear without rales or rhonchi Cardiovascular: RRR, heart sounds normal, no murmur or gallops, no peripheral edema Abdomen: soft and NT, no  HSM,  BS normal, well healed scars from abdominoplasty evident Musculoskeletal: No deformities, no cyanosis or clubbing Neuro: alert, non focal Skin: Warm, no lesions or rashes    CXR  Procedure date:  01/21/2009  Findings:      Findings: The lungs are clear. The heart is within normal limits in size. No bony abnormality is seen.   IMPRESSION: No active lung disease.    Vascular Study  Procedure date:  10/29/2008  Findings:      Results:  venous doppler both LE   :  No DVT seen   CT of Chest  Procedure date:  01/22/2009  Findings:      No PE seen Focal area of atelectasis or scar in the posterior aspect of the RLL in area of prior pulmonary infarct.  This area is smaller than before CT Chest 10/28/08: PE RLL with infarct  CT chest 11/12/08: NO PE,  rounded consolidation within posterior right lung base  Impression & Recommendations:  Problem # 1:  PULMONARY INFARCTION (ICD-415.19) Assessment Improved RUL pulmonary infarction that is slowly healing,  doubt any other acute process plan: NSAIDs for 10days and f/u PT/INR while on nsaids I need to see the actual studies from 1/10 and 4/10 CT chests,  have asked for CDs on both for my personal review  Her updated medication list for this problem includes:    Coumadin 5 Mg Tabs (Warfarin sodium) ..... Sunday - 6 mg, monday - 6 mg, tuesday - 6 mg, wednesday - 5 mg, thursday - 6 mg, friday - 6 mg, saturday - 5 mg    Coumadin 1 Mg Tabs (Warfarin sodium) ..... Sunday - 6 mg, monday - 6 mg, tuesday - 6 mg, wednesday - 5 mg, thursday - 6 mg, friday - 6 mg, saturday - 5 mg  Orders: New Patient Level V (84132)  Medications Added to Medication List This Visit: 1)  Hydrocodone-acetaminophen 5-500 Mg Tabs (Hydrocodone-acetaminophen) .... As needed pain 2)  Tylenol Extra Strength 500 Mg Tabs (Acetaminophen) .... 2-3 daily  Complete Medication List: 1)  Vitamin C-acerola 500 Mg Chew (Ascorbic acid) .... Take 1 tablet by mouth  once a day 2)  Claritin 10 Mg Tabs (Loratadine) .Marland KitchenMarland KitchenMarland Kitchen  Take 1 tablet by mouth once a day 3)  Coumadin 5 Mg Tabs (Warfarin sodium) .... Sunday - 6 mg, monday - 6 mg, tuesday - 6 mg, wednesday - 5 mg, thursday - 6 mg, friday - 6 mg, saturday - 5 mg 4)  Coumadin 1 Mg Tabs (Warfarin sodium) .... Sunday - 6 mg, monday - 6 mg, tuesday - 6 mg, wednesday - 5 mg, thursday - 6 mg, friday - 6 mg, saturday - 5 mg 5)  Hydrocodone-acetaminophen 5-500 Mg Tabs (Hydrocodone-acetaminophen) .... As needed pain 6)  Tylenol Extra Strength 500 Mg Tabs (Acetaminophen) .... 2-3 daily  Patient Instructions: 1)  May use ibuprofen 400mg  4 times daily for pain 2)  If you use ibuprofen you will need to obtain an INR/Protime 4-5 days after starting this medication 3)  Return in one month

## 2010-11-08 NOTE — Assessment & Plan Note (Signed)
Summary: allergies   Vital Signs:  Patient profile:   35 year old female Height:      64 inches Weight:      124 pounds BMI:     21.36 O2 Sat:      100 % on Room air Temp:     98.0 degrees F oral Pulse rate:   52 / minute BP sitting:   114 / 78  (left arm) Cuff size:   regular  Vitals Entered By: Payton Spark CMA (July 27, 2009 1:14 PM)  O2 Flow:  Room air  Serial Vital Signs/Assessments:                                PEF    PreRx  PostRx Time      O2 Sat  O2 Type     L/min  L/min  L/min   By 1:37 PM                       260    250    320     Kim Johnson  Comments: 1:37 PM Pt in yellow zone By: Kathlene November   CC: Chest congestion x 1 week.    Primary Care Provider:  Seymour Bars DO  CC:  Chest congestion x 1 week. Marland Kitchen  History of Present Illness: 35 yo WF presents for cough x 1 wk, mostly dry.  She is taking Claritin x 2 wks and Mucinex DM which has helped a little bit.  She has some sore throat and hoarsness.  She is having more SOB w/ exercise.  She has not used her inhaled.  She has some chest tightness.  She thinks that she has some seasonal allergies.  No rhinorrhea.  She is rarely sneezing.  No ocular symptoms.  She had a fever last Wed (not recorded).      Current Medications (verified): 1)  Vitamin C-Acerola 500 Mg Chew (Ascorbic Acid) .... Take 1 Tablet By Mouth Once A Day 2)  Ventolin Hfa 108 (90 Base) Mcg/act Aers (Albuterol Sulfate) .... 2 Puffs Q 4-6 Hrs Prn 3)  Coumadin 6 Mg Tabs (Warfarin Sodium) .... Sunday - 7 Mg, Monday - 7 Mg, Tuesday - 7 Mg, Wednesday - 6 Mg, Thursday - 7 Mg, Friday - 7 Mg, Saturday - 7 Mg 4)  Coumadin 1 Mg Tabs (Warfarin Sodium) .... Sunday - 7 Mg, Monday - 7 Mg, Tuesday - 7 Mg, Wednesday - 6 Mg, Thursday - 7 Mg, Friday - 7 Mg, Saturday - 7 Mg  Allergies (verified): 1)  ! Pcn  Comments:  Nurse/Medical Assistant: Payton Spark CMA (July 27, 2009 1:15 PM)  Past History:  Past Medical History: [id number] PE  10/26/2008    -neg DVT study of legs with PE (on coumadin until 08-09-2009) [facility]  Family History: Reviewed history from 01/27/2009 and no changes required. mother Wilson's Dz, HTN sister HTN father ETOH No clotting disorders in family Lung CA PGF COPD MGM  Social History: Reviewed history from 09/16/2008 and no changes required. 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 Has 2 daughters 35 & 6 Lives w/ fiance. Regular exercise. Has Mirena IUD  Review of Systems      See HPI  Physical Exam  General:  alert, well-developed, well-nourished, and well-hydrated.   Head:  normocephalic and atraumatic.  sinuses NTTP Eyes:  conjunctiva clear Nose:  normal  turbinates; no rhinorrhea Mouth:  throat injected with cobblestoning hoarse Neck:  L>R submandibular LA Lungs:  Normal respiratory effort, chest expands symmetrically. Lungs are clear to auscultation, no crackles or wheezes. dry cough.  no splinting Heart:  Normal rate and regular rhythm. S1 and S2 normal without gallop, murmur, click, rub or other extra sounds. Skin:  color normal.  color normal.   Psych:  good eye contact, not anxious appearing, and not depressed appearing.  good eye contact, not anxious appearing, and not depressed appearing.     Impression & Recommendations:  Problem # 1:  ALLERGIC RHINITIS CAUSE UNSPECIFIED (ICD-477.9)  Allergies with allergic bronchospasm.  PFs in yellow zone. Treat with Prednisone 40 mg once daily x 5 days. RF Ventolin to use q 6 hrs for the next wk. Stay on daily Claritin. F/U with Dr Delford Field in 2 wks.  Orders: Peak Flow Rate (94150)  Complete Medication List: 1)  Vitamin C-acerola 500 Mg Chew (Ascorbic acid) .... Take 1 tablet by mouth once a day 2)  Ventolin Hfa 108 (90 Base) Mcg/act Aers (Albuterol sulfate) .... 2 puffs q 4-6 hrs prn 3)  Coumadin 6 Mg Tabs (Warfarin sodium) .... Sunday - 7 mg, monday - 7 mg, tuesday - 7 mg, wednesday - 6 mg, thursday  - 7 mg, friday - 7 mg, saturday - 7 mg 4)  Coumadin 1 Mg Tabs (Warfarin sodium) .... Sunday - 7 mg, monday - 7 mg, tuesday - 7 mg, wednesday - 6 mg, thursday - 7 mg, friday - 7 mg, saturday - 7 mg 5)  Prednisone 20 Mg Tabs (Prednisone) .... 2 tabs by mouth once a day x 5 days  Patient Instructions: 1)  For allergies/ allergic cough --> 2)  Treat with 5 days of Prednisone 40 mg once daily. 3)  Stay on Claritin 10 mg once daily. 4)  Use Albuterol Inhaler 4 x a day for the next wk. 5)  OK to stay on Mucinex DM as needed for cough. 6)  Will get you back in with Dr Wright in 2 wks. Prescriptions: VENTOLIN HFA 108 (90 BASE) MCG/ACT AERS (ALBUTEROL SULFATE) 2 puffs q 4-6 hrs prn  #1 x 0   Entered and Authorized by:   Karen Bowen DO   Signed by:   Karen Bowen DO on 07/27/2009   Method used:   [facility]        [id number]       [address]       [place], [place]  [address]       Ph: [phone] or [phone]       Fax: [phone]   RxID:   [id number] PREDNISONE 20 MG TABS (PREDNISONE) 2 tabs by mouth once a day x 5 days  #10 x 0   Entered and Authorized by:   Karen Bowen DO   Signed by:   Karen Bowen DO on 07/27/2009   Method used:   [facility]        [id number]       [address]       [place], [place]  [address]       Ph: [phone] or [phone]       Fax: [phone]   RxID:   [id number]

## 2010-11-08 NOTE — Progress Notes (Signed)
  Phone Note Call from Patient   Caller: Patient Summary of Call: Pt c/o having ? muscle spasm/flutter- like feeling  in L lung x 2 days. Pt has also had bad HA x 3 days. Pt worried bc she has never had this happen before and had blood clot in lung last year. What is your recommendation for Pt? Initial call taken by: Payton Spark CMA,  November 02, 2009 3:23 PM  Follow-up for Phone Call        recommend ED visit. Follow-up by: Seymour Bars DO,  November 02, 2009 3:41 PM     Appended Document:  Pt will go to ED

## 2010-11-08 NOTE — Assessment & Plan Note (Signed)
Summary: INR  Nurse Visit   Vitals Entered By: Payton Spark CMA (May 17, 2009 3:57 PM)  Allergies: 1)  ! Pcn Laboratory Results   Blood Tests     PT: 14.2 s   (Normal Range: 10.6-13.4)  INR: 2.0   (Normal Range: 0.88-1.12   Therap INR: 2.0-3.5)    Orders Added: 1)  Fingerstick [36416] 2)  Protime INR [[id number]]   Anticoagulation Management History:      The patient is on coumadin and comes in today for a routine follow up visit.  She is being anticoagulated due to the first episode of deep venous thrombosis and/or pulmonary embolism.  Anticipated length of treatment is 6 months.  Her last INR was 1.3 and today's INR is 2.0.    Anticoagulation Management Assessment/Plan:      The target INR is 2.0-3.0.  She is to have a PT/INR in 4 weeks.  Anticoagulation instructions were given to patient.         Current Anticoagulation Instructions: The patient is to continue with the same dose of coumadin.  This dosage includes:  Coumadin 6 mg tabs and Coumadin 1 mg tabs:  Sunday - 6 mg, Monday - 8 mg, Tuesday - 6 mg, Wednesday - 8 mg, Thursday - 6 mg, Friday - 8 mg, Saturday - 6 mg.    Repeat PT/INR in 4 weeks.

## 2010-11-08 NOTE — Assessment & Plan Note (Signed)
Summary: pleuritic chest pain   Vital Signs:  Patient profile:   35 year old female Height:      63.75 inches Weight:      122 pounds O2 Sat:      100 % Temp:     97.8 degrees F oral Pulse rate:   68 / minute BP sitting:   113 / 66  (left arm) Cuff size:   regular  Vitals Entered By: Harlene Salts (January 21, 2009 2:06 PM)  History of Present Illness: right upper side pain in lung,SOB and chest tightness last night  35 yo WF presents for R posterior thoracic pleuritic pain that she started having with her PE in late Jan 2010.  It started to get better but is worse again.  Started having worse pain in the middle of the night last night.  Woke her from sleep.  Felt SOB.  Not takng her Lorazepam for anxiety.  Denies cough, sputum or fevers.  Denies palpitations.  Had chest tightness yesterday.  She is on Coumadin and has an INR of 1.9 today.  She has improved overall from Jan.  Back at work and started exercising again but definitely feels limited now with her pleurisy.  Taking Tylenol which helps some.    Anticoagulation Management History:      The patient is on coumadin and comes in today for a routine follow up visit.  The patient is taking Coumadin for the first episode of deep venous thrombosis and/or pulmonary embolism.  Anticipated length of treatment is 6 months.  Her last INR was 1.7 and today's INR is 1.9.    Current Medications (verified): 1)  Vitamin C-Acerola 500 Mg Chew (Ascorbic Acid) .... Take 1 Tablet By Mouth Once A Day 2)  Promethazine Hcl 25 Mg Tabs (Promethazine Hcl) .... Take 1 Tablet By Mouth Once A Day As Needed Nausea 3)  Lorazepam 0.5 Mg Tabs (Lorazepam) .Marland Kitchen.. 1 Tab By Mouth Two Times A Day As Needed Anxiety 4)  Mucinex Dm 30-600 Mg Xr12h-Tab (Dextromethorphan-Guaifenesin) .... Take 1 Tablet By Mouth Once A Day 5)  Claritin 10 Mg Tabs (Loratadine) .... Take 1 Tablet By Mouth Once A Day 6)  Coumadin 5 Mg Tabs (Warfarin Sodium) .... Sunday - 6 Mg, Monday - 5 Mg,  Tuesday - 6 Mg, Wednesday - 5 Mg, Thursday - 5 Mg, Friday - 6 Mg, Saturday - 5 Mg 7)  Coumadin 1 Mg Tabs (Warfarin Sodium) .... Sunday - 6 Mg, Monday - 5 Mg, Tuesday - 6 Mg, Wednesday - 5 Mg, Thursday - 5 Mg, Friday - 6 Mg, Saturday - 5 Mg  Allergies: 1)  ! Pcn  Past History:  Past Medical History:    Reviewed history from 11/04/2008 and no changes required:    [id number]    PE 10-2008    [facility]  Past Surgical History:    Reviewed history from 12/08/2008 and no changes required:    LTCS x 1    breast augmentation/ abdominoplasty 12-09  Family History:    Reviewed history from 09/16/2008 and no changes required:       mother Wilson's Dz, HTN       sister HTN       father ETOH  Social History:    Reviewed history from 09/16/2008 and no changes required:       Investment banker, corporate        HS degree.       Divorced and shares custody.  Has 2 daughters 25 & 6       Lives w/ fiance.       Regular exercise.       Has Mirena IUD  Review of Systems      See HPI  Physical Exam  General:  alert, well-developed, well-nourished, and well-hydrated.   Head:  normocephalic and atraumatic.   Eyes:  conjunctiva clear Nose:  no nasal discharge.   Mouth:  good dentition and pharynx pink and moist.   Neck:  no masses.   Chest Wall:  tender R posterior thoracic region to touch Lungs:  splinting with deep inpiration.  good air movement into the bases bilat, normal respiratory effort, no intercostal retractions, no accessory muscle use, no crackles, and no wheezes.   Heart:  normal rate, regular rhythm, and no murmur.   Skin:  color normal.  no pallor Cervical Nodes:  No lymphadenopathy noted Psych:  good eye contact, not anxious appearing, and not depressed appearing.     Impression & Recommendations:  Problem # 1:  PLEURISY (ICD-511.0) Assessment Deteriorated Would expect improvement after 3 months.  CXR today to r/o infiltrate since she has been splinting.  CXR negative  today.  Pulse ox 100%.  Treat pain with Tylenol + Heat.  Refer to pulm since she has continued to c/o chest wall pain that is limiting her ability to exercise and has her very worried.  I reassured her that she does not have another clot with her INR being so close to therapeutic window.  She has had issues with anxiety and I did tell her that she could try her Lorazepam is symptoms return tonight. Orders: T-DG Chest 2 View [id number] [id number]) Pulmonary Referral (Pulmonary)  Problem # 2:  PULMONARY EMBOLISM (ICD-415.19) On Month 3 of coumadin for a post - op PE.  Almost therapeutic.  Increase dose slightly and recheck in 3 wks.   The following medications were removed from the medication list:    Coumadin 5 Mg Tabs (Warfarin sodium) ..... Sunday - 6 mg, monday - 5 mg, tuesday - 6 mg, wednesday - 5 mg, thursday - 5 mg, friday - 6 mg, saturday - 5 mg    Coumadin 1 Mg Tabs (Warfarin sodium) ..... Sunday - 6 mg, monday - 5 mg, tuesday - 6 mg, wednesday - 5 mg, thursday - 5 mg, friday - 6 mg, saturday - 5 mg Her updated medication list for this problem includes:    Coumadin 5 Mg Tabs (Warfarin sodium) ..... Sunday - 6 mg, monday - 6 mg, tuesday - 6 mg, wednesday - 5 mg, thursday - 6 mg, friday - 6 mg, saturday - 5 mg    Coumadin 1 Mg Tabs (Warfarin sodium) ..... Sunday - 6 mg, monday - 6 mg, tuesday - 6 mg, wednesday - 5 mg, thursday - 6 mg, friday - 6 mg, saturday - 5 mg  Orders: T-DG Chest 2 View ([id number]) Fingerstick ([id number]) Protime INR ([id number])  Complete Medication List: 1)  Vitamin C-acerola 500 Mg Chew (Ascorbic acid) .... Take 1 tablet by mouth once a day 2)  Promethazine Hcl 25 Mg Tabs (Promethazine hcl) .... Take 1 tablet by mouth once a day as needed nausea 3)  Lorazepam 0.5 Mg Tabs (Lorazepam) .Marland Kitchen.. 1 tab by mouth two times a day as needed anxiety 4)  Mucinex Dm 30-600 Mg Xr12h-tab (Dextromethorphan-guaifenesin) .... Take 1 tablet by mouth once a day 5)  Claritin 10 Mg Tabs (Loratadine) .... Take 1  tablet by mouth  once a day 6)  Coumadin 5 Mg Tabs (Warfarin sodium) .... "Sunday - 6 mg, monday - 6 mg, tuesday - 6 mg, wednesday - 5 mg, thursday - 6 mg, friday - 6 mg, saturday - 5 mg 7)  Coumadin 1 Mg Tabs (Warfarin sodium) .... Sunday - 6 mg, monday - 6 mg, tuesday - 6 mg, wednesday - 5 mg, thursday - 6 mg, friday - 6 mg, saturday - 5 mg  Anticoagulation Management Assessment/Plan:      The target INR is 2.0-3.0.  She is to have a PT/INR in 3 weeks.  Anticoagulation instructions were given to patient.         Current Anticoagulation Instructions: The patient's dosage of coumadin will be increased.  The new dosage includes:   Coumadin 5 mg tabs and Coumadin 1 mg tabs:  Sunday - 6 mg, Monday - 6 mg, Tuesday - 6 mg, Wednesday - 5 mg, Thursday - 6 mg, Friday - 6 mg, Saturday - 5 mg.    Repeat PT/INR in 3 weeks.    Patient Instructions: 1)  CXR today.  I will call you w/ results. 2)  Set up with [facility] in [place].  Lydia will call you with info. 3)  Use Tylenol and heat as needed for pain. 4)  Use Lorazepam as needed for anxiety related to chest pains. 5)  Return for next INR in 3 wks. Prescriptions: COUMADIN 1 MG TABS (WARFARIN SODIUM) Sunday - 6 mg, Monday - 6 mg, Tuesday - 6 mg, Wednesday - 5 mg, Thursday - 6 mg, Friday - 6 mg, Saturday - 5 mg  #30 x 1   Entered and Authorized by:   Karen Bowen DO   Signed by:   Karen Bowen DO on 01/21/2009   Method used:   [facility]        [id number]       [address]       , [place]  [address]       Ph: [phone] or [phone]       Fax: [phone]   RxID:   [id number] COUMADIN 5 MG TABS (WARFARIN SODIUM) Sunday - 6 mg, Monday - 6 mg, Tuesday - 6 mg, Wednesday - 5 mg, Thursday - 6 mg, Friday - 6 mg, Saturday - 5 mg  #30 x 1   Entered and Authorized by:   Karen Bowen DO   Signed by:   Karen Bowen DO on 01/21/2009   Method used:   [facility] to        [facility]       [address]       [place], [place]  [address]       Ph: [phone] or [phone]       Fax: [phone]   RxID:   [id number]     Laboratory Results   Blood Tests   Date/Time Recieved: January 21, 2009 2:37 PM  Date/Time Reported: January 21, 2009 2:37 PM   PT: 16.8 s   (Normal Range: 10.6-13.4)  INR: 1.9   (Normal Range: 0.88-1.12   Therap INR: 2.0-3.5)

## 2010-11-08 NOTE — Letter (Signed)
Summary: Out of Work  [facility]  [address], [address]   [place], [place] [address]   Phone: [phone]  Fax: [phone]    November 25, 2008   Employee:  KAYDI KLEY    To Whom It May Concern:   For Medical reasons, please excuse the above named employee from work for the following dates:  Start:   Feb 17, 18, 19th able to work half days  End:   Feb 20th  If you need additional information, please feel free to contact our office.         Sincerely,    Seymour Bars DO

## 2010-11-08 NOTE — Progress Notes (Signed)
Summary: STILL HAVING ALOT OF HEAD CONGESTION  Phone Note Call from Patient Call back at [phone]   Caller: Patient Call For: Seymour Bars DO Summary of Call: PATIENT HAS FINISHED ALL ABTS. STILL HAVING ALOT CONGESTION IN HEAD, CHEST DISCOMFORT BUT BETTER AND IS USING INHALER. [facility] [address] [address] [facility].  Initial call taken by: Harlene Salts,  September 24, 2008 2:32 PM  Follow-up for Phone Call        I reviewed her last office visit.  If still not 100%, I'd like to see her back to repeat her lung exam.  She should be better after 10 days of Clarithromycin.   Follow-up by: Seymour Bars DO,  September 24, 2008 2:50 PM  Additional Follow-up for Phone Call Additional follow up Details #1::        PATIENT INFORMED VIA VOICEMAIL. Additional Follow-up by: Harlene Salts,  September 24, 2008 3:05 PM

## 2010-11-08 NOTE — Letter (Signed)
Summary: Generic Letter  [facility]  [address], [address]   [place], [place] [address]   Phone: [phone]  Fax: [phone]    11/25/2008  Andrea Mcguire [address], [place]  [address]  Dear [name],  This letter verifies that you are cleared to return to work on Monday, February 22nd, 2010.         Sincerely,    Seymour Bars DO [facility] Kathryne Sharper

## 2010-11-08 NOTE — Letter (Signed)
Summary: [facility]   Imported By: Lanelle Bal 11/09/2008 15:28:48  _____________________________________________________________________  External Attachment:    Type:   Image     Comment:   External Document

## 2010-11-08 NOTE — Assessment & Plan Note (Signed)
Summary: INR//[facility]  Nurse Visit  Pt not seen. No charge- Sent to lab.   Allergies: 1)  ! Pcn  Orders Added: 1)  T-Protime, Auto [[id number]]

## 2010-11-08 NOTE — Assessment & Plan Note (Signed)
Summary: INR  Nurse Visit   Vitals Entered By: Harlene Salts (January 01, 2009 3:12 PM)   History of Present Illness: PT/INR CHECK  Anticoagulation Management History:      The patient is on coumadin and comes in today for a routine follow up visit.  The patient is taking Coumadin for the first episode of deep venous thrombosis and/or pulmonary embolism.  Anticipated length of treatment is 6 months.  Her last INR was 2.1 and today's INR is 1.5.      Allergies: 1)  ! Pcn  Laboratory Results   Blood Tests   Date/Time Received: January 01, 2009 3:12 PM  Date/Time Reported: January 01, 2009 3:12 PM   PT: 15.2 s   (Normal Range: 10.6-13.4)  INR: 1.5   (Normal Range: 0.88-1.12   Therap INR: 2.0-3.5)      Orders Added: 1)  Fingerstick [36416] 2)  Protime INR [85610]    Prescriptions: COUMADIN 1 MG TABS (WARFARIN SODIUM) Sunday - 5 mg, Monday - 5 mg, Tuesday - 6 mg, Wednesday - 5 mg, Thursday - 5 mg, Friday - 6 mg, Saturday - 5 mg  #30 x 0   Entered and Authorized by:   Seymour Bars DO   Signed by:   Seymour Bars DO on 01/01/2009   Method used:   Print then Give to Patient   RxID:   [id number] COUMADIN 5 MG TABS (WARFARIN SODIUM) Sunday - 5 mg, Monday - 5 mg, Tuesday - 6 mg, Wednesday - 5 mg, Thursday - 5 mg, Friday - 6 mg, Saturday - 5 mg  #30 x 0   Entered and Authorized by:   Seymour Bars DO   Signed by:   Seymour Bars DO on 01/01/2009   Method used:   Print then Give to Patient   RxID:   [id number]  ] Laboratory Results   Blood Tests     PT: 15.2 s   (Normal Range: 10.6-13.4)  INR: 1.5   (Normal Range: 0.88-1.12   Therap INR: 2.0-3.5)      Anticoagulation Management Assessment/Plan:      The target INR is 2.0-3.0.  She is to have a PT/INR in 2 weeks.  Anticoagulation instructions were given to patient.         Current Anticoagulation Instructions: The patient's dosage of coumadin will be increased.  The new dosage includes: Coumadin 5 mg tabs and  Coumadin 1 mg tabs:  Sunday - 5 mg, Monday - 5 mg, Tuesday - 6 mg, Wednesday - 5 mg, Thursday - 5 mg, Friday - 6 mg, Saturday - 5 mg.    Repeat PT/INR in 2 weeks.

## 2010-11-08 NOTE — Assessment & Plan Note (Signed)
Summary: Pulmonary OV   Primary Shakendra Griffeth/Referring Massiah Longanecker:  Seymour Bars DO  CC:  1 mo f/u with PFTs.   Pt states breathing is a little better than it was but it's not back to basline. Pt states SOB is worse with activity and c/o chest tightness. Pt has concerns regarding INR.  Marland Kitchen  History of Present Illness: Pulmonary Consultation  This is a 35yo WF had acute PE RUL 1/10.  [facility] one week and d/c on coumadin.  Pain was better then worse for past month.  4/15 CXR ok.  Went to ED 4/17  CT scan was neg for resid or new PE but RUL infarct resolving seen.  Had pain in chest with PE prior to dx.  Also PNA and rx with ABX.  Pain is R side, sharp, stabbing.  Not able to run but can walk.  PE related to postop state.  Had Abdominoplasty late 12/09 and was recovering from same when had the PE.   No cough,  and is dyspneic with exertio.  Not able to get a deep breath.  No cigarettes.  Hydrocodone and tylenol help the pain.  No LE edema or pain.  No mucous or cough.  No hemoptysis.  No fever. No heartburn or indigestion.  Here for further eval.  May 05, 2009 12:01 PM Pt here for f/u of pulmonary infarctions in RUL that occured with postop PE 1/10.  Pt is still having some pleuritic chest pain.  Still with dyspnea on exertion but all symptoms have improved.  Pt is using aleve as needed.  No mucous production.  No wheeze.  No f/c/s.  Plan is to continue therapy until 07/2009.   Current Medications (verified): 1)  Vitamin C-Acerola 500 Mg Chew (Ascorbic Acid) .... Take 1 Tablet By Mouth Once A Day 2)  Coumadin 6 Mg Tabs (Warfarin Sodium) .... Sunday - 6 Mg, Monday - 6 Mg, Tuesday - 7 Mg, Wednesday - 6 Mg, Thursday - 6 Mg, Friday - 7 Mg, Saturday - 6 Mg 3)  Coumadin 1 Mg Tabs (Warfarin Sodium) .... Sunday - 6 Mg, Monday - 6 Mg, Tuesday - 7 Mg, Wednesday - 6 Mg, Thursday - 6 Mg, Friday - 7 Mg, Saturday - 6 Mg 4)  Ventolin Hfa 108 (90 Base) Mcg/act Aers (Albuterol Sulfate) .... 2 Puffs Q  4-6 Hrs Prn  Allergies (verified): 1)  ! Pcn  Past History:  Past medical, surgical, family and social histories (including risk factors) reviewed, and no changes noted (except as noted below).  Past Medical History: Reviewed history from 01/27/2009 and no changes required. [id number] PE 10/26/2008    -neg DVT study of legs with PE [facility]  Past Surgical History: Reviewed history from 01/27/2009 and no changes required. LTCS x 1 breast augmentation/ abdominoplasty 10/18/08  Family History: Reviewed history from 01/27/2009 and no changes required. mother Wilson's Dz, HTN sister HTN father ETOH No clotting disorders in family Lung CA PGF COPD MGM  Social History: Reviewed history from 09/16/2008 and no changes required. 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 Has 2 daughters 14 & 6 Lives w/ fiance. Regular exercise. Has Mirena IUD  Review of Systems       The patient complains of shortness of breath with activity and chest pain.  The patient denies shortness of breath at rest, productive cough, non-productive cough, coughing up blood, irregular heartbeats, acid heartburn, indigestion, loss of appetite, weight change, abdominal pain, difficulty swallowing, sore  throat, tooth/dental problems, headaches, nasal congestion/difficulty breathing through nose, sneezing, itching, ear ache, anxiety, depression, hand/feet swelling, joint stiffness or pain, rash, change in color of mucus, and fever.    Vital Signs:  Patient profile:   35 year old female Height:      64 inches Weight:      122 pounds BMI:     21.02 O2 Sat:      97 % on Room air Temp:     97.8 degrees F oral Pulse rate:   74 / minute BP sitting:   108 / 66  (left arm) Cuff size:   regular  Vitals Entered By: [name] Dimitri RN (May 05, 2009 11:55 AM)  O2 Flow:  Room air CC: 1 mo f/u with PFTs.   Pt states breathing is a little better than it was but it's not back to basline. Pt states  SOB is worse with activity and c/o chest tightness. Pt has concerns regarding INR.   Comments Medications reviewed with patient [name] Dimitri RN  May 05, 2009 11:56 AM    Physical Exam  Additional Exam:  Gen: Pleasant, well-nourished, in no distress,  normal affect ENT: No lesions,  mouth clear,  oropharynx clear, no postnasal drip Neck: No JVD, no TMG, no carotid bruits Lungs: No use of accessory muscles, no dullness to percussion, clear without rales or rhonchi Cardiovascular: RRR, heart sounds normal, no murmur or gallops, no peripheral edema Abdomen: soft and NT, no HSM,  BS normal, well healed scars from abdominoplasty evident Musculoskeletal: No deformities, no cyanosis or clubbing Neuro: alert, non focal Skin: Warm, no lesions or rashes    Impression & Recommendations:  Problem # 1:  PULMONARY INFARCTION (ICD-415.19) Assessment Improved Pulmonary embolism with pulmonary infarction slowly better plan: continue coumadin therapy until mid 07/2009. will need hypercoagulability workup once off coumadin therapy  Her updated medication list for this problem includes:    Coumadin 6 Mg Tabs (Warfarin sodium) ..... Sunday - 6 mg, monday - 8 mg, tuesday - 6 mg, wednesday - 8 mg, thursday - 6 mg, friday - 8 mg, saturday - 6 mg    Coumadin 1 Mg Tabs (Warfarin sodium) ..... Sunday - 6 mg, monday - 8 mg, tuesday - 6 mg, wednesday - 8 mg, thursday - 6 mg, friday - 8 mg, saturday - 6 mg  Orders: Est. Patient Level III (08657)  Complete Medication List: 1)  Vitamin C-acerola 500 Mg Chew (Ascorbic acid) .... Take 1 tablet by mouth once a day 2)  Ventolin Hfa 108 (90 Base) Mcg/act Aers (Albuterol sulfate) .... 2 puffs q 4-6 hrs prn 3)  Coumadin 6 Mg Tabs (Warfarin sodium) .... Sunday - 6 mg, monday - 8 mg, tuesday - 6 mg, wednesday - 8 mg, thursday - 6 mg, friday - 8 mg, saturday - 6 mg 4)  Coumadin 1 Mg Tabs (Warfarin sodium) .... Sunday - 6 mg, monday - 8 mg, tuesday - 6 mg, wednesday -  8 mg, thursday - 6 mg, friday - 8 mg, saturday - 6 mg  Patient Instructions: 1)  No change in medications 2)  Return toward end of october 3)  Stop coumadin mid October 2010 4)  I will order coagulation testing once off coumadin for two weeks  Appended Document: Pulmonary OV Add:  Full PFTs reviewed and are completely normal.  pw

## 2010-11-08 NOTE — Assessment & Plan Note (Signed)
Summary: PT/INR  Nurse Visit   Vitals Entered By: Harlene Salts (January 14, 2009 8:41 AM)   History of Present Illness: PT/INR CHECK  Anticoagulation Management History:      The patient is on coumadin and comes in today for a routine follow up visit.  The patient is taking Coumadin for the first episode of deep venous thrombosis and/or pulmonary embolism.  Anticipated length of treatment is 6 months.  Her last INR was 1.5 and today's INR is 1.7.      Allergies: 1)  ! Pcn  Laboratory Results   Blood Tests   Date/Time Received: January 14, 2009 8:41 AM  Date/Time Reported: January 14, 2009 8:41 AM   PT: 15.9 s   (Normal Range: 10.6-13.4)  INR: 1.7   (Normal Range: 0.88-1.12   Therap INR: 2.0-3.5)      Orders Added: 1)  Fingerstick [36416] 2)  Protime INR [place]    ] Laboratory Results   Blood Tests     PT: 15.9 s   (Normal Range: 10.6-13.4)  INR: 1.7   (Normal Range: 0.88-1.12   Therap INR: 2.0-3.5)      Anticoagulation Management Assessment/Plan:      The target INR is 2.0-3.0.  She is to have a PT/INR in 3 weeks.  Anticoagulation instructions were given to patient.         Current Anticoagulation Instructions: The patient's dosage of coumadin will be increased.  The new dosage includes: Coumadin 5 mg tabs and Coumadin 1 mg tabs:  Sunday - 6 mg, Monday - 5 mg, Tuesday - 6 mg, Wednesday - 5 mg, Thursday - 5 mg, Friday - 6 mg, Saturday - 5 mg.    Repeat PT/INR in 3 weeks.

## 2010-11-08 NOTE — Assessment & Plan Note (Signed)
Summary: Pulmonary OV   Primary Arien Benincasa/Referring Isella Slatten:  Seymour Bars DO  CC:  Follow up on coumadin.  Pt stopped coumadin approx 1 wk ago.   states she is feeling a lot better. Marland Kitchen  History of Present Illness: Pulmonary OV  This is a 35yo WF had acute PE RUL 1/10.  [facility] one week and d/c on coumadin.  Pain was better then worse for past month.  4/15 CXR ok.  Went to ED 4/17  CT scan was neg for resid or new PE but RUL infarct resolving seen.  Had pain in chest with PE prior to dx.  Also PNA and rx with ABX.  Pain is R side, sharp, stabbing.  Not able to run but can walk.  PE related to postop state.  Had Abdominoplasty late 12/09 and was recovering from same when had the PE.   No cough,  and is dyspneic with exertio.  Not able to get a deep breath.  No cigarettes.  Hydrocodone and tylenol help the pain.  No LE edema or pain.  No mucous or cough.  No hemoptysis.  No fever. No heartburn or indigestion.  Here for further eval.  May 05, 2009 12:01 PM Pt here for f/u of pulmonary infarctions in RUL that occured with postop PE 1/10.  Pt is still having some pleuritic chest pain.  Still with dyspnea on exertion but all symptoms have improved.  Pt is using aleve as needed.  No mucous production.  No wheeze.  No f/c/s.  Plan is to continue therapy until 07/2009.  August 12, 2009 11:16 AM Now off coumadin since 10/10.  No new issues. Doing well.  No new complaints. no edema in feet  Preventive Screening-Counseling & Management  Alcohol-Tobacco     Smoking Status: never  Current Medications (verified): 1)  Vitamin C-Acerola 500 Mg Chew (Ascorbic Acid) .... Take 1 Tablet By Mouth Once A Day 2)  Ventolin Hfa 108 (90 Base) Mcg/act Aers (Albuterol Sulfate) .... 2 Puffs Q 4-6 Hrs Prn 3)  Claritin 10 Mg Tabs (Loratadine) .... Once Daily  Allergies (verified): 1)  ! Pcn  Past History:  Past medical, surgical, family and social histories (including risk factors)  reviewed, and no changes noted (except as noted below).  Past Medical History: Reviewed history from 07/27/2009 and no changes required. [id number] PE 10/26/2008    -neg DVT study of legs with PE (on coumadin until 08-09-2009) [facility]  Past Surgical History: Reviewed history from 01/27/2009 and no changes required. LTCS x 1 breast augmentation/ abdominoplasty 10/18/08  Clinical Reports Reviewed:  CT of Chest:  01/22/2009: CT of Chest:  No PE seen Focal area of atelectasis or scar in the posterior aspect of the RLL in area of prior pulmonary infarct.  This area is smaller than before  11/12/2008: CT of Chest:  Resolved PE,  RLL infarct   10/28/2008: CT of Chest:  Acute PE in RLL with infarct   Family History: Reviewed history from 01/27/2009 and no changes required. mother Wilson's Dz, HTN sister HTN father ETOH No clotting disorders in family Lung CA PGF COPD MGM  Social History: Reviewed history from 09/16/2008 and no changes required. 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 Has 2 daughters 26 & 6 Lives w/ fiance. Regular exercise. Has Mirena IUD  Review of Systems  The patient denies shortness of breath with activity, shortness of breath at rest, productive cough, non-productive cough, coughing up blood,  chest pain, irregular heartbeats, acid heartburn, indigestion, loss of appetite, weight change, abdominal pain, difficulty swallowing, sore throat, tooth/dental problems, headaches, nasal congestion/difficulty breathing through nose, sneezing, itching, ear ache, anxiety, depression, hand/feet swelling, joint stiffness or pain, rash, change in color of mucus, and fever.    Vital Signs:  Patient profile:   35 year old female Height:      64 inches Weight:      124 pounds BMI:     21.36 O2 Sat:      99 % on Room air Temp:     98.2 degrees F oral Pulse rate:   64 / minute BP sitting:   124 / 84  (left arm) Cuff size:   regular  Vitals  Entered By: [name] Dimitri RN (August 12, 2009 10:58 AM)  O2 Flow:  Room air CC: Follow up on coumadin.  Pt stopped coumadin approx 1 wk ago.   states she is feeling a lot better.  Comments Medications reviewed with patient [name] Dimitri RN  August 12, 2009 10:58 AM    Physical Exam  Additional Exam:  Gen: Pleasant, well-nourished, in no distress,  normal affect ENT: No lesions,  mouth clear,  oropharynx clear, no postnasal drip Neck: No JVD, no TMG, no carotid bruits Lungs: No use of accessory muscles, no dullness to percussion, clear without rales or rhonchi Cardiovascular: RRR, heart sounds normal, no murmur or gallops, no peripheral edema Abdomen: soft and NT, no HSM,  BS normal, well healed scars from abdominoplasty evident Musculoskeletal: No deformities, no cyanosis or clubbing Neuro: alert, non focal Skin: Warm, no lesions or rashes    Impression & Recommendations:  Problem # 1:  PULMONARY INFARCTION (ICD-415.19) Assessment Improved Pulmonary embolism with pulmonary infarction resolved  plan: no further coumadin check hypercoaguable panel  f/u 6 months  Medications Added to Medication List This Visit: 1)  Claritin 10 Mg Tabs (Loratadine) .... Once daily  Complete Medication List: 1)  Vitamin C-acerola 500 Mg Chew (Ascorbic acid) .... Take 1 tablet by mouth once a day 2)  Ventolin Hfa 108 (90 Base) Mcg/act Aers (Albuterol sulfate) .... 2 puffs q 4-6 hrs prn 3)  Claritin 10 Mg Tabs (Loratadine) .... Once daily  Other Orders: Est. Patient Level III (96295) T- * Misc. Laboratory test [id number])  Patient Instructions: 1)  Stay off coumadin 2)  Hypercoagulable labs will be drawn today, I will call with results 3)  Return 6 months for recheck [facility] office

## 2010-11-08 NOTE — Assessment & Plan Note (Signed)
Summary: SOB   Vital Signs:  Patient profile:   35 year old female Height:      64 inches Weight:      122 pounds BMI:     21.02 O2 Sat:      100 % on Room air Temp:     98.0 degrees F oral Pulse rate:   52 / minute BP sitting:   122 / 72  (left arm) Cuff size:   regular  Vitals Entered By: Payton Spark CMA (April 19, 2009 11:21 AM)  O2 Flow:  Room air CC: Feels SOB, heaviness in chest, and tired x 1 week. Used rescue inhaler w/ relief. Worse on Sat- came in from pool and was a bit dizzy. Did stay hydrated while in the heat.    Primary Care Provider:  Seymour Bars DO  CC:  Feels SOB, heaviness in chest, and and tired x 1 week. Used rescue inhaler w/ relief. Worse on Sat- came in from pool and was a bit dizzy. Did stay hydrated while in the heat. Marland Kitchen  History of Present Illness: 35 yo WF presents for SOB.  Had a PE in Jan 2010 after abdominoplasty and is on her last month of coumadin.  She has had pleurisy since her PE.  Saw Dr Delford Field in April who reviewed a f/u Chest CT which was neg for new PE.  Showed RLL atx from PE.  She was treated with Ibuprofen for pleurisy.  She had labs drawn in April showing no iron deficiency anemia and normal thyroid function.  She has complained of SOB and fatigue ever since her PE.  She denies hx of asthma or allergies and has never been a smoker.  She tried to get back into running but feels limited due to chest tightness and SOB.  She has used an Albuterol inhaler over the last wk that has provided her some relief.  She has occas sharp CP from her pleurisy but no dull pains or heart palpitations.  She had an episode this weekend after relaxing at the pool where she felt dizzy and lightheaded but no syncope.  Doesn't think she was dehydrated.    Current Medications (verified): 1)  Vitamin C-Acerola 500 Mg Chew (Ascorbic Acid) .... Take 1 Tablet By Mouth Once A Day 2)  Coumadin 6 Mg Tabs (Warfarin Sodium) .... Sunday - 6 Mg, Monday - 6 Mg, Tuesday - 7 Mg,  Wednesday - 6 Mg, Thursday - 6 Mg, Friday - 7 Mg, Saturday - 6 Mg 3)  Coumadin 1 Mg Tabs (Warfarin Sodium) .... Sunday - 6 Mg, Monday - 6 Mg, Tuesday - 7 Mg, Wednesday - 6 Mg, Thursday - 6 Mg, Friday - 7 Mg, Saturday - 6 Mg  Allergies (verified): 1)  ! Pcn  Comments:  Nurse/Medical Assistant: Payton Spark CMA (April 19, 2009 11:23 AM) The patient's medications were reviewed with the patient and were updated in the Medication List.  Past History:  Past Medical History: Reviewed history from 01/27/2009 and no changes required. [id number] PE 10/26/2008    -neg DVT study of legs with PE [facility]  Past Surgical History: Reviewed history from 01/27/2009 and no changes required. LTCS x 1 breast augmentation/ abdominoplasty 10/18/08  Family History: Reviewed history from 01/27/2009 and no changes required. mother Wilson's Dz, HTN sister HTN father ETOH No clotting disorders in family Lung CA PGF COPD MGM  Social History: Reviewed history from 09/16/2008 and no changes required. 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 Has 2 daughters 21 & 6 Lives w/ fiance. Regular exercise. Has Mirena IUD  Review of Systems      See HPI  Physical Exam  General:  alert, well-developed, well-nourished, and well-hydrated.  tanned WF in NAD Head:  normocephalic and atraumatic.   Eyes:  conjunctiva clear Nose:  no nasal discharge.   Mouth:  good dentition and pharynx pink and moist.  cobblestoning Neck:  no masses.   Chest Wall:  no tenderness.   Lungs:  Normal respiratory effort, chest expands symmetrically. Lungs are clear to auscultation, no crackles or wheezes. Heart:  Normal rate and regular rhythm. S1 and S2 normal without gallop, murmur, click, rub or other extra sounds. Pulses:  cap RF < 2 sec Extremities:  no LE edema Skin:  color normal.   Cervical Nodes:  No lymphadenopathy noted Psych:  good eye contact, not anxious appearing, and not depressed  appearing.     Impression & Recommendations:  Problem # 1:  DYSPNEA (ICD-786.05) Possible asthma.  Send back to Dr Delford Field for PFTs.  Has Ventolin HFA to use 15 min prior to exercise to see if this gives her symptomatic improvment w/ exercise. Labs from 3 mos ago show no anemia, iron def or thyroid dysfunction. Lung exam is normal and pulse ox is reassuring. No indication of cardiac etiology. Planning to come off coumadin at the end of this month for a post op PE from Jan 2010.  Consider testing for Wilson's Dz given her mom's hx.    Complete Medication List: 1)  Vitamin C-acerola 500 Mg Chew (Ascorbic acid) .... Take 1 tablet by mouth once a day 2)  Coumadin 6 Mg Tabs (Warfarin sodium) .... Sunday - 6 mg, monday - 6 mg, tuesday - 7 mg, wednesday - 6 mg, thursday - 6 mg, friday - 7 mg, saturday - 6 mg 3)  Coumadin 1 Mg Tabs (Warfarin sodium) .... Sunday - 6 mg, monday - 6 mg, tuesday - 7 mg, wednesday - 6 mg, thursday - 6 mg, friday - 7 mg, saturday - 6 mg 4)  Ventolin Hfa 108 (90 Base) Mcg/act Aers (Albuterol sulfate) .... 2 puffs q 4-6 hrs prn  Other Orders: Pulmonary Function Test (PFT)  Patient Instructions: 1)  Use Ventolin 2 puffs 15 min prior to exercise. 2)  Set up for PFTs with Dr Wright to r/o asthma. 3)  Labs normal in March. Prescriptions: VENTOLIN HFA 108 (90 BASE) MCG/ACT AERS (ALBUTEROL SULFATE) 2 puffs q 4-6 hrs prn  #1 x 0   Entered and Authorized by:   Lenyx Boody DO   Signed by:   Jamirah Zelaya DO on 04/19/2009   Method used:   Electronically to        [facility]  [address] [id number] (retail)       [address]       [place], [place]  [address]       Ph: [phone] or [phone]       Fax: [phone]   RxID:   [id number]

## 2010-11-08 NOTE — Assessment & Plan Note (Signed)
Summary: sinusitis   Vital Signs:  Patient Profile:   35 Years Old Female Height:     63.75 inches Weight:      123 pounds BMI:     21.36 O2 Sat:      100 % Temp:     97.2 degrees F oral Pulse rate:   77 / minute BP sitting:   104 / 68  (left arm) Cuff size:   regular  Vitals Entered By: Harlene Salts (September 24, 2008 3:40 PM)                 PCP:  Seymour Bars DO  Chief Complaint:  head congestion continues.  History of Present Illness: 35 yo WF diagnosed 2 wks ago with pneumonia at urgent care seen back today. She completed 10 days of Clarithromycin.  Cough much improved.  No CP.  Afebrile.  Still a little SOB and tired.  When I saw her, I added an inhaler for bronchospasm (w/o dx of asthma).  She is taking Mucinex DM and Rx cough syrup at bedtime which is helping.  She is a non smoker and has no hx of asthma or allergies.  Her head became very congested yesterday.  She has HA, poor appetitie.  Not taking any decongestants.  Daughter sick w/ a cold.  Feels very run - down.      Current Allergies: ! PCN  Past Medical History:    Reviewed history from 09/16/2008 and no changes required:       [id number]              [facility]   Social History:    Reviewed history from 09/16/2008 and no changes required:       Investment banker, corporate        HS degree.       Divorced and shares custody.       Has 2 daughters 38 & 6       Lives w/ fiance.       Regular exercise.       Has Mirena IUD    Review of Systems      See HPI   Physical Exam  General:     alert, well-developed, well-nourished, and well-hydrated.   Head:     normocephalic and atraumatic.  sinuses NTTP Eyes:     eyes slightly watery, conjunctiva clear Ears:     R TM occluded by wax, L TM normal Nose:     boggy turbinates with clear rhinorrhea and head congestion Mouth:     good dentition and pharynx pink and moist.  throat injected with clear post nasal drip Neck:     supple.  anterior  cervical chain LA, shotty Lungs:     Normal respiratory effort, chest expands symmetrically. Lungs are clear to auscultation, no crackles or wheezes.  improved air movement. Heart:     Normal rate and regular rhythm. S1 and S2 normal without gallop, murmur, click, rub or other extra sounds. Skin:     color normal.      Impression & Recommendations:  Problem # 1:  OTHER ACUTE SINUSITIS (ICD-461.8) Completed treatment for CAP and now has copious head congestion, fatigue, malaise and rhinorrhea.  Check CBC to look for high eosinophil count that would suggest allergic component.  Treat with decongestant and 5 days Zithromax.  Lungs much improved!  Call if not feeling better in 7-10 days. The following medications were removed from the medication list:  Clarithromycin 500 Mg Tabs (Clarithromycin) .Marland Kitchen... Take two by mouth daily times 10 days  Her updated medication list for this problem includes:    Chest Congestion/cough Relief 20-400 Mg Tabs (Dextromethorphan-guaifenesin) .Marland Kitchen... Take 1 tablet by mouth two times a day    Hydrocodone-homatropine 5-1.5 Mg/73ml Syrp (Hydrocodone-homatropine) .Marland Kitchen... 1 tsp by mouth at bedtime as needed cough    Zithromax Z-pak 250 Mg Tabs (Azithromycin) .Marland Kitchen... 2 tabs by mouth x 1 day then 1 tab by mouth daily x 4 days   Complete Medication List: 1)  Chest Congestion/cough Relief 20-400 Mg Tabs (Dextromethorphan-guaifenesin) .... Take 1 tablet by mouth two times a day 2)  Hydrocodone-homatropine 5-1.5 Mg/34ml Syrp (Hydrocodone-homatropine) .Marland Kitchen.. 1 tsp by mouth at bedtime as needed cough 3)  Proair Hfa 108 (90 Base) Mcg/act Aers (Albuterol sulfate) .... 2 inhalations q 4-6 hrs prn 4)  Zithromax Z-pak 250 Mg Tabs (Azithromycin) .... 2 tabs by mouth x 1 day then 1 tab by mouth daily x 4 days  Other Orders: T-CBC w/Diff ([id number])   Patient Instructions: 1)  Take 5 days of Zithromax. 2)  Start Sudafed for head congestion (from behind the pharmacy counter).  Use Rx  cough medicine at night as needed. 3)  Call if not improved in 1 wk.   4)  Labs today. 5)  I will call you w/ result tomorrow.   Prescriptions: ZITHROMAX Z-PAK 250 MG TABS (AZITHROMYCIN) 2 tabs by mouth x 1 day then 1 tab by mouth daily x 4 days  #1 pack x 0   Entered and Authorized by:   Seymour Bars DO   Signed by:   Seymour Bars DO on 09/24/2008   Method used:   [facility]ronically to        [facility] [phone]* (retail)       [address] [place], [place]  [address]       Ph: [phone] or [phone]       Fax: [phone]   RxID:   [id number]  ]

## 2010-11-08 NOTE — Miscellaneous (Signed)
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

## 2010-11-08 NOTE — Assessment & Plan Note (Signed)
Summary: NOV pneumonia/ Peak Flows   Vital Signs:  Patient Profile:   35 Years Old Female Height:     63.75 inches Weight:      122 pounds BMI:     21.18 O2 Sat:      100 % Temp:     97.4 degrees F oral Pulse rate:   68 / minute BP sitting:   101 / 72  (left arm) Cuff size:   regular  Vitals Entered By: Harlene Salts (September 16, 2008 9:48 AM)                 Serial Vital Signs/Assessments:                                PEF    PreRx  PostRx Time      O2 Sat  O2 Type     L/min  L/min  L/min   By 10:32 AM                             260    250     Harlene Salts  Comments: 10:32 AM PATIENT IN YELLOW ZONE BOTH TIMES.LM By: Harlene Salts    PCP:  Seymour Bars DO  Chief Complaint:  NOV and congestion in chest and can't get it up.SOB .  History of Present Illness: 35 yo WF presents for NOV.  She was diagnosed with H1N1 Flu 2 wks ago.  Her had fevers and was seen at [facility] 8 days ago and is on a 10 day course of Clarithromycin.  She is improving.  No longer febrile but left with a dry cough, worse at night.  Non - smoker.  SOB and fatigued and not able to work out.  Taking Mucinex.  No hx of asthma.  Has some anterior chest tightness with cough.  Has not had any heavy periods.      Current Allergies: ! PCN  Past Medical History:    [id number]        [facility]  Past Surgical History:    LTCS x 1   Family History:    mother Wilson's Dz, HTN    sister HTN    father ETOH  Social History:    Investment banker, corporate     HS degree.    Divorced and shares custody.    Has 2 daughters 54 & 6    Lives w/ fiance.    Regular exercise.    Has Mirena IUD    Review of Systems       no fevers/sweats/weakness, unexplained wt loss/gain, no change in vision, no difficulty hearing, ringing in ears, no hay fever/allergies, no CP/discomfort, no palpitations, no breast lump/nipple discharge, + cough/wheeze, no blood in stool, no N/V/D, no nocturia, no leaking urine, no  unusual vag bleeding, no vaginal/penile discharge, no muscle/joint pain, no rash, no new/changing mole, no HA, no memory loss, no anxiety, no sleep problem, no depression, no unexplained lumps, no easy bruising/bleeding, no concern with sexual function    Physical Exam  General:     alert, well-developed, well-nourished, and well-hydrated.   Head:     normocephalic and atraumatic.   Eyes:     conjunctiva clear Ears:     EACs patent; TMs translucent and gray with good cone of light and bony landmarks.  Nose:     scant  nasal congestion Mouth:     throat mildly injected with clear post nasal drip Neck:     supple and no masses.   Chest Wall:     no tenderness.   Lungs:     coarse BS at the bases bilat with decreased BS.  Nonlabored.  No wheezing Heart:     normal rate, regular rhythm, and no murmur.   Skin:     color normal and no rashes.   Cervical Nodes:     shotty anterior cervical LA    Impression & Recommendations:  Problem # 1:  PNEUMONIA, ORGANISM UNSPECIFIED (ICD-486) On day 8 of 10 of Clarithromycin.  Improving but left with dry cough and bronchospasm.  Treat with Albuterol 2.5 mg neb after PFs in yellow zone.  Use Albuterol HFA 4 x a day for the next wk.  Rx cough medicine at bedtime.  Expect SOB and fatigue to improve in the next 7 days.   Her updated medication list for this problem includes:    Clarithromycin 500 Mg Tabs (Clarithromycin) .Marland Kitchen... Take two by mouth daily times 10 days   Problem # 2:  DYSPNEA (ICD-786.05) Secondary to #1.  Will need to repeat PFs in 2-3 wks.  Hgb 14.2 ruling out anemia.   Orders: Fingerstick (36416) Hgb (85018) Peak Flow Rate (94150) Nebulizer Tx ([address]109) Albuterol Sulfate Sol 1mg  unit dose (U0454)   Complete Medication List: 1)  Clarithromycin 500 Mg Tabs (Clarithromycin) .... Take two by mouth daily times 10 days 2)  Chest Congestion/cough Relief 20-400 Mg Tabs (Dextromethorphan-guaifenesin) .... Take 1 tablet by mouth two  times a day 3)  Hydrocodone-homatropine 5-1.5 Mg/51ml Syrp (Hydrocodone-homatropine) .Marland Kitchen.. 1 tsp by mouth at bedtime as needed cough 4)  Proair Hfa 108 (90 Base) Mcg/act Aers (Albuterol sulfate) .... 2 inhalations q 4-6 hrs prn   Patient Instructions: 1)  REturn in 3 wks to repeat Peak Flows and make sure that you do not have underlying asthma diagnosis. 2)  Use Rx cough syrup at bedtime and Mucinex DM in the AM. 3)  Finish out all antibiotics. 4)  Use inhaler 4 x a day for the next wk, then as needed. 5)  I will plan on screening for Wilson's Dz at next visit.   Prescriptions: PROAIR HFA 108 (90 BASE) MCG/ACT AERS (ALBUTEROL SULFATE) 2 inhalations q 4-6 hrs prn  #1 x 0   Entered and Authorized by:   Seymour Bars DO   Signed by:   Seymour Bars DO on 09/16/2008   Method used:   [facility] to        [facility] [phone]* (retail)       [address] [place], [place]  [address]       Ph: [phone] or [phone]       Fax: [phone]   RxID:   [id number] HYDROCODONE-HOMATROPINE 5-1.5 MG/5ML SYRP (HYDROCODONE-HOMATROPINE) 1 tsp by mouth at bedtime as needed cough  #100 ml x 0   Entered and Authorized by:   Seymour Bars DO   Signed by:   Seymour Bars DO on 09/16/2008   Method used:   Print then Give to Patient   RxID:   [id number]  ] Laboratory Results   Blood Tests   Date/Time Recieved: September 16, 2008 10:32 AM  Date/Time Reported: September 16, 2008 10:32 AM    CBC HGB:  14.2 g/dL   (Normal Range: 18.8-41.6 in Males, 12.0-15.0 in Females)  Appended Document: NOV pneumonia/ Peak Flows        Current Allergies: ! PCN        Complete Medication List: 1)  Clarithromycin 500 Mg Tabs (Clarithromycin) .... Take two by mouth daily times 10 days 2)  Chest Congestion/cough Relief 20-400 Mg Tabs (Dextromethorphan-guaifenesin) .... Take 1 tablet by mouth two times a day 3)  Hydrocodone-homatropine 5-1.5 Mg/39ml Syrp (Hydrocodone-homatropine)  .Marland Kitchen.. 1 tsp by mouth at bedtime as needed cough 4)  Proair Hfa 108 (90 Base) Mcg/act Aers (Albuterol sulfate) .... 2 inhalations q 4-6 hrs prn    ]  Medication Administration  Medication # 1:    Medication: Albuterol Sulfate Sol 1mg  unit dose    Diagnosis: DYSPNEA (ICD-786.05)    Dose: 2.5MG     Route: inhaled    Exp Date: 12/07/2009    Lot #: [id number]    Mfr: NEPHRON    Patient tolerated medication without complications    Given by: Harlene Salts (September 16, 2008 10:42 AM)

## 2010-11-08 NOTE — Progress Notes (Signed)
Summary: COUMADIN DOSE  Phone Note From Other Clinic Call back at [phone]   Caller: LINDA Call For: DOCTOR Summary of Call: DR Harrington Challenger [facility] HAS PATIENT THERE NOW ON EMERGENCY BASIS FOR TOOTH EXTRACTION AND WOULD LIKE TO KNOW ABOUT COUMADIN WHETHER OR NOT IT NEED TO BE HELD. Initial call taken by: Harlene Salts,  December 22, 2008 8:55 AM  Follow-up for Phone Call        I called him back and spoke to him about this.  She will have tooth extracted today and will hold tonight and tomorrow's coumadin.  RTC Fri for INR. Follow-up by: Seymour Bars DO,  December 22, 2008 9:13 AM

## 2010-11-08 NOTE — Miscellaneous (Signed)
Summary: Orders Update pft charges  Clinical Lists Changes  Orders: Added new Service order of Carbon Monoxide diffusing w/capacity (94720) - Signed Added new Service order of Lung Volumes (94240) - Signed Added new Service order of Spirometry (Pre & Post) (94060) - Signed 

## 2010-11-08 NOTE — Assessment & Plan Note (Signed)
Summary: f/u PE   Vital Signs:  Patient Profile:   35 Years Old Female Height:     63.75 inches Weight:      124 pounds BMI:     21.53 O2 Sat:      100 % Temp:     97.9 degrees F oral Pulse rate:   67 / minute BP sitting:   109 / 68  (left arm) Cuff size:   regular  Vitals Entered By: Harlene Salts (November 24, 2008 9:02 AM)                 PCP:  Seymour Bars DO  Chief Complaint:  PT/INR check, questions about meds, and and returning to work.  History of Present Illness: 35 yo WF presents for f/u PE and pleurisy.  On coumadin 5 mg/ day.  Doing well.  No bleeding or bruising.  Still has pleuritic, R -sided chest pain.  Tylenol is helping.  Using incentive spirometry.  Moving around much better.  Pain has improved.  Went to the ED last wk for hyperventilating and was given Lorazepam.  Not using much now.  Starting to walk more.  Going back to work on Monday.  Feels ready.  Not as SOB.    Anticoagulation Management History:      The patient is on coumadin and comes in today for a routine follow up visit.  Anticoagulation is being administered due to the first episode of deep venous thrombosis and/or pulmonary embolism.  Anticipated length of treatment is 6 months.  Her last INR was 2.2 and today's INR is 2.0.       Current Allergies: ! PCN  Past Medical History:    Reviewed history from 11/04/2008 and no changes required:       [id number]       PE 10-2008       [facility]   Social History:    Reviewed history from 09/16/2008 and no changes required:       Investment banker, corporate        HS degree.       Divorced and shares custody.       Has 2 daughters 72 & 6       Lives w/ fiance.       Regular exercise.       Has Mirena IUD    Review of Systems      See HPI   Physical Exam  General:     alert, well-developed, well-nourished, and well-hydrated.  more comfortable Mouth:     pharynx pink and moist.   Neck:     no masses.   Lungs:     Normal respiratory  effort, chest expands symmetrically. Lungs are clear to auscultation, no crackles or wheezes. less splinting.  improved R base aeration Heart:     normal rate, regular rhythm, and no murmur.   Skin:     color normal.      Impression & Recommendations:  Problem # 1:  PULMONARY EMBOLISM (ICD-415.19) On 2nd months of anti-coagulation.  Clot secondary to preceding abdominoplasty.  Hypercoag w/u was negative.  OK to keep Mirena IUD in as I don't think this is the reason she had the clot.  INR 2.0 today, at goal.  Recheck in 2 wks.  Cont x 6 mos total. The following medications were removed from the medication list:    Coumadin 5 Mg Tabs (Warfarin sodium) ..... Sunday - 5 mg, monday - 5 mg, tuesday -  5 mg, wednesday - 5 mg, thursday - 5 mg, friday - 5 mg, saturday - 5 mg  Her updated medication list for this problem includes:    Coumadin 5 Mg Tabs (Warfarin sodium) ..... Sunday - 5 mg, monday - 5 mg, tuesday - 5 mg, wednesday - 5 mg, thursday - 5 mg, friday - 5 mg, saturday - 5 mg  Orders: Fingerstick (62952) Protime INR (84132)   Problem # 2:  PLEURISY (ICD-511.0) Improving slowly.  Tylenol 3 g/ day helping.  Doing deep breathing and using Lorazepam sparingly.  Complete Medication List: 1)  Vitamin C-acerola 500 Mg Chew (Ascorbic acid) .... Take 1 tablet by mouth once a day 2)  Promethazine Hcl 25 Mg Tabs (Promethazine hcl) .... Take 1 tablet by mouth once a day as needed nausea 3)  Lorazepam 1 Mg Tabs (Lorazepam) .... Take 1 tablet by mouth three times a day as needed anxiety 4)  Coumadin 5 Mg Tabs (Warfarin sodium) .... Sunday - 5 mg, monday - 5 mg, tuesday - 5 mg, wednesday - 5 mg, thursday - 5 mg, friday - 5 mg, saturday - 5 mg  Anticoagulation Management Assessment/Plan:      The patient's current anticoagulation dose is Coumadin 5 mg tabs:  Sunday - 5 mg, Monday - 5 mg, Tuesday - 5 mg, Wednesday - 5 mg, Thursday - 5 mg, Friday - 5 mg, Saturday - 5 mg.  The target INR is 2.0-3.0.   She is to have a PT/INR in 3 weeks.  Anticoagulation instructions were given to patient.    Prior Anticoagulation Instructions: The patient is to continue with the same dose of coumadin.  This dosage includes: Coumadin 5 mg tabs:  Sunday - 5 mg, Monday - 5 mg, Tuesday - 5 mg, Wednesday - 5 mg, Thursday - 5 mg, Friday - 5 mg, Saturday - 5 mg.    Repeat PT/INR in 2 weeks.    Current Anticoagulation Instructions: The patient is to continue with the same dose of coumadin.  This dosage includes: Coumadin 5 mg tabs:  Sunday - 5 mg, Monday - 5 mg, Tuesday - 5 mg, Wednesday - 5 mg, Thursday - 5 mg, Friday - 5 mg, Saturday - 5 mg.    Repeat PT/INR in 3 weeks.     Patient Instructions: 1)  For pleurisy: 2)  Continue deep breathing exercises. 3)  Tylenol 500 mg 2 tabs 3 x a day as needed. 4)  OK to eat veggies/ salad on a daily basis. 5)  OK to resume exercise gradually. 6)  OK to get back to work on Monday. 7)  Next INR in 2 wks.   Prescriptions: COUMADIN 5 MG TABS (WARFARIN SODIUM) Sunday - 5 mg, Monday - 5 mg, Tuesday - 5 mg, Wednesday - 5 mg, Thursday - 5 mg, Friday - 5 mg, Saturday - 5 mg  #30 x 1   Entered and Authorized by:   Seymour Bars DO   Signed by:   Seymour Bars DO on 11/24/2008   Method used:   Print then Give to Patient   RxID:   [id number]   Laboratory Results   Blood Tests   Date/Time Recieved: November 24, 2008 9:08 AM  Date/Time Reported: November 24, 2008 9:08 AM   PT: 17.5 s   (Normal Range: 10.6-13.4)  INR: 2.0   (Normal Range: 0.88-1.12   Therap INR: 2.0-3.5)

## 2010-11-08 NOTE — Assessment & Plan Note (Signed)
Summary: INR  Nurse Visit   Vitals Entered By: Harlene Salts (November 10, 2008 9:58 AM)                 Prior Medications: VITAMIN C-ACEROLA 500 MG CHEW (ASCORBIC ACID) Take 1 tablet by mouth once a day PROMETHAZINE HCL 25 MG TABS (PROMETHAZINE HCL) Take 1 tablet by mouth once a day as needed nausea CEFTIN 500 MG TABS (CEFUROXIME AXETIL)  Current Allergies: ! PCN Laboratory Results   Blood Tests   Date/Time Received: November 10, 2008 9:59 AM  Date/Time Reported: November 10, 2008 9:59 AM   PT: 18.1 s   (Normal Range: 10.6-13.4)  INR: 2.2   (Normal Range: 0.88-1.12   Therap INR: 2.0-3.5)      Orders Added: 1)  Fingerstick [36416] 2)  Protime INR [85610]    Prescriptions: COUMADIN 5 MG TABS (WARFARIN SODIUM) Sunday - 5 mg, Monday - 5 mg, Tuesday - 5 mg, Wednesday - 5 mg, Thursday - 5 mg, Friday - 5 mg, Saturday - 5 mg  #30 x 1   Entered and Authorized by:   Seymour Bars DO   Signed by:   Seymour Bars DO on 11/10/2008   Method used:   Print then Give to Patient   RxID:   [id number]  ] Laboratory Results   Blood Tests     PT: 18.1 s   (Normal Range: 10.6-13.4)  INR: 2.2   (Normal Range: 0.88-1.12   Therap INR: 2.0-3.5)     Chief Complaint:  PT/INR CHECK.  Anticoagulation Management History:      The patient is on coumadin and comes in today for a routine follow up visit.  Anticoagulation is being administered due to the first episode of deep venous thrombosis and/or pulmonary embolism.  Anticipated length of treatment is 6 months.  Her last INR was 2.2 and today's INR is 2.2.     Anticoagulation Management Assessment/Plan:      The patient's current anticoagulation dose is Coumadin 5 mg tabs:  Sunday - 5 mg, Monday - 5 mg, Tuesday - 5 mg, Wednesday - 5 mg, Thursday - 5 mg, Friday - 5 mg, Saturday - 5 mg.  The target INR is 2.0-3.0.  She is to have a PT/INR in 2 weeks.  Anticoagulation instructions were given to patient.    Current Anticoagulation  Instructions: The patient is to continue with the same dose of coumadin.  This dosage includes: Coumadin 5 mg tabs:  Sunday - 5 mg, Monday - 5 mg, Tuesday - 5 mg, Wednesday - 5 mg, Thursday - 5 mg, Friday - 5 mg, Saturday - 5 mg.    Repeat PT/INR in 2 weeks.

## 2010-11-08 NOTE — Assessment & Plan Note (Signed)
Summary: PT/INR  Nurse Visit   Vitals Entered By: Kathlene November (April 08, 2009 8:24 AM)    Allergies: 1)  ! Pcn  Laboratory Results   Blood Tests   Date/Time Received: 04/08/2009 Date/Time Reported: 04/08/2009  PT: 15.2 s   (Normal Range: 10.6-13.4)  INR: 1.5   (Normal Range: 0.88-1.12   Therap INR: 2.0-3.5)      Orders Added: 1)  Fingerstick [36416] 2)  Protime INR [69629]      Anticoagulation Management History:      The patient is on coumadin and comes in today for a routine follow up visit.  The patient is on Coumadin for the first episode of deep venous thrombosis and/or pulmonary embolism.  Anticipated length of treatment is 6 months.  Her last INR was 1.8 and today's INR is 1.5.    Anticoagulation Management Assessment/Plan:      The target INR is 2.0-3.0.  She is to have a PT/INR in 4 weeks.  Anticoagulation instructions were given to patient.         Current Anticoagulation Instructions: The patient's dosage of coumadin will be increased.  The new dosage includes:  Coumadin 6 mg tabs and Coumadin 1 mg tabs:  Sunday - 6 mg, Monday - 6 mg, Tuesday - 7 mg, Wednesday - 6 mg, Thursday - 6 mg, Friday - 7 mg, Saturday - 6 mg.    Repeat PT/INR in 4 weeks.

## 2010-11-10 NOTE — Assessment & Plan Note (Signed)
Summary: URI   Vital Signs:  Patient profile:   35 year old female Height:      64 inches Weight:      124 pounds BMI:     21.36 O2 Sat:      100 % on Room air Temp:     98.5 degrees F oral Pulse rate:   61 / minute BP sitting:   116 / 72  (left arm) Cuff size:   regular  Vitals Entered By: Payton Spark CMA (November 02, 2010 11:38 AM)  O2 Flow:  Room air CC: Chest congestion and body aches x 2 weeks.   Primary Care Provider:  Seymour Bars DO  CC:  Chest congestion and body aches x 2 weeks.Marland Kitchen  History of Present Illness: 35 yo WF presents for 2 wks of not feeling well with runny nose, weakness and chest tightness with cough.  She has some green phlegm.  Denies GI upset.  Has a little less appetite.  She has some R sided pleuritic pain.  She is taking Aleve and Mucinex D which helps some.  She is not a smoker.    She did not get a flu shot.  She only had one low grade fever of 100.2.    Current Medications (verified): 1)  Vitamin C-Acerola 500 Mg Chew (Ascorbic Acid) .... Take 1 Tablet By Mouth Once A Day 2)  Ventolin Hfa 108 (90 Base) Mcg/act Aers (Albuterol Sulfate) .... 2 Puffs Q 4-6 Hrs Prn 3)  Claritin 10 Mg Tabs (Loratadine) .... Once Daily  Allergies (verified): 1)  ! Pcn  Past History:  Past Medical History: [id number] PE 10/26/2008 -- post op    -neg DVT study of legs with PE (on coumadin until 08-09-2009) [facility]or Ramp [facility]  Past Surgical History: Reviewed history from 01/27/2009 and no changes required. LTCS x 1 breast augmentation/ abdominoplasty 10/18/08  Social History: Reviewed history from 09/16/2008 and no changes required. 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 Has 2 daughters 88 & 6 Lives w/ fiance. Regular exercise. Has Mirena IUD  Review of Systems      See HPI  Physical Exam  General:  alert, well-developed, well-nourished, and well-hydrated.   Head:  normocephalic and atraumatic.  sinuses NTTP Eyes:  conjunctiva  clear Ears:  EACs patent; TMs translucent and gray with good cone of light and bony landmarks.  Nose:  clear rhinorrhea; nasal congestion present Mouth:  o/p mildly injected.  no exudates, vesicles or tonsilar hypertrophy Neck:  shoddy tender anterior cervical chain LNs Lungs:  Normal respiratory effort, chest expands symmetrically. Lungs are clear to auscultation, no crackles or wheezes. Heart:  Normal rate and regular rhythm. S1 and S2 normal without gallop, murmur, click, rub or other extra sounds. Skin:  color normal and no rashes.     Impression & Recommendations:  Problem # 1:  VIRAL URI (ICD-465.9) CBC normal today.  Continue supportive care measures for viral URI.    Instructed on symptomatic treatment. Call if symptoms persist or worsen.   Complete Medication List: 1)  Vitamin C-acerola 500 Mg Chew (Ascorbic acid) .... Take 1 tablet by mouth once a day 2)  Ventolin Hfa 108 (90 Base) Mcg/act Aers (Albuterol sulfate) .... 2 puffs q 4-6 hrs prn 3)  Claritin 10 Mg Tabs (Loratadine) .... Once daily  Other Orders: T-CBC w/Diff ([id number])  Patient Instructions: 1)  CBC today. 2)  Will call you w/ results today and start treatment if indicated. 3)  Otherwise, continue  OTC cold meds, rest and clear fluids.  4)  Most respiratory viruses resolve within 2-3 wks.   Orders Added: 1)  T-CBC w/Diff [[id number]] 2)  Est. Patient Level II [66440]

## 2010-11-11 NOTE — Letter (Signed)
Summary: [facility]  [facility]   Imported By: Lanelle Bal 11/10/2008 09:40:20  _____________________________________________________________________  External Attachment:    Type:   Image     Comment:   External Document

## 2010-12-19 ENCOUNTER — Encounter: Payer: Self-pay | Admitting: Family Medicine

## 2010-12-19 ENCOUNTER — Ambulatory Visit (INDEPENDENT_AMBULATORY_CARE_PROVIDER_SITE_OTHER): Payer: 59 | Admitting: Family Medicine

## 2010-12-19 DIAGNOSIS — F3289 Other specified depressive episodes: Secondary | ICD-10-CM | POA: Insufficient documentation

## 2010-12-19 DIAGNOSIS — F329 Major depressive disorder, single episode, unspecified: Secondary | ICD-10-CM

## 2010-12-27 NOTE — Assessment & Plan Note (Signed)
Summary: mood   Vital Signs:  Patient profile:   35 year old female Height:      64 inches Weight:      125 pounds BMI:     21.53 O2 Sat:      100 % on Room air Pulse rate:   58 / minute BP sitting:   137 / 90  (left arm) Cuff size:   regular  Vitals Entered By: Payton Spark CMA (December 19, 2010 2:33 PM)  O2 Flow:  Room air CC: Depression, anxiety, and "on edge" x 2 months   Primary Care Provider:  Seymour Bars DO  CC:  Depression, anxiety, and and "on edge" x 2 months.  History of Present Illness: 35 yo WF presents for problems with frequent crying, feeling 'on edge' and being emotional.  She feels like she has a lot of on her plate.  Her mom is going thru a divorce and she is dealing with some childhood memories.  Her husband is going back to school and she has to juggle the kids more.  She has been resistant to meds.  She feels really anxious.  She has a goood support system.    Allergies: 1)  ! Pcn  Past History:  Past Medical History: Reviewed history from 11/02/2010 and no changes required. [id number] PE 10/26/2008 -- post op    -neg DVT study of legs with PE (on coumadin until 08-09-2009) [facility] Ramp [facility]  Past Surgical History: Reviewed history from 01/27/2009 and no changes required. LTCS x 1 breast augmentation/ abdominoplasty 10/18/08  Family History: Reviewed history from 01/27/2009 and no changes required. mother Wilson's Dz, HTN sister HTN father ETOH No clotting disorders in family Lung CA PGF COPD MGM  Social History: Reviewed history from 09/16/2008 and no changes required. 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 Has 2 daughters 65 & 6 Lives w/ fiance. Regular exercise. Has Mirena IUD  Review of Systems      See HPI  Physical Exam  General:  alert, well-developed, well-nourished, and well-hydrated.   Psych:  good eye contact, tearful, and slightly anxious.     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 PHQ 9 score 8.  Declined tx with daily SSRI or SNRI but will try low dose Xanax as needed + counseling to start.  Call if any worsening mood.  She is not a threat to herself of orthers. f/u in 6 wks. Her updated medication list for this problem includes:    Alprazolam 0.5 Mg Tabs (Alprazolam) .Marland Kitchen... 1 tab by mouth two times a day as needed anxiety  Orders: Psychology Referral (Psychology)  Complete Medication List: 1)  Vitamin C-acerola 500 Mg Chew (Ascorbic acid) .... Take 1 tablet by mouth once a day 2)  Ventolin Hfa 108 (90 Base) Mcg/act Aers (Albuterol sulfate) .... 2 puffs q 4-6 hrs prn 3)  Claritin 10 Mg Tabs (Loratadine) .... Once daily 4)  Alprazolam 0.5 Mg Tabs (Alprazolam) .Marland Kitchen.. 1 tab by mouth two times a day as needed anxiety  Patient Instructions: 1)  Take Alprazolam 1/2 to 1 tab by mouth up to twice a day for anxiety. 2)  Will get you in with Olegario Messier downstairs for counseling. 3)  Call if any problems including worsening mood. 4)  Return for f/u in 6 wks. Prescriptions: ALPRAZOLAM 0.5 MG TABS (ALPRAZOLAM) 1 tab by mouth two times a day as needed anxiety  #60  x 0   Entered and Authorized by:   Seymour Bars DO   Signed by:   Seymour Bars DO on 12/19/2010   Method used:   Printed then faxed to ...       [facility]  [address] [phone]* (retail)       [address] [place], [place]  [address]       Ph: [phone] or [phone]       Fax: [phone]   RxID:   [id number]    Orders Added: 1)  Psychology Referral [Psychology] 2)  Est. Patient Level III [36644]

## 2011-01-03 ENCOUNTER — Ambulatory Visit (HOSPITAL_COMMUNITY): Payer: 59 | Admitting: Psychology

## 2011-01-03 ENCOUNTER — Ambulatory Visit (HOSPITAL_COMMUNITY): Payer: 59 | Admitting: Licensed Clinical Social Worker

## 2011-02-16 ENCOUNTER — Other Ambulatory Visit: Payer: Self-pay | Admitting: *Deleted

## 2011-02-16 MED ORDER — ALPRAZOLAM 0.5 MG PO TABS: 0.5000 mg | ORAL_TABLET | Freq: Two times a day (BID) | ORAL | Status: DC | PRN

## 2011-02-24 NOTE — Op Note (Signed)
[facility]  Patient:    Andrea Mcguire, Andrea Mcguire                       MRN: [id number] Proc. Date: 01/25/01 Adm. Date:  [id number] Disc. 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  OPERATION:                    Excision of right pilar cyst.  SURGEON:                      Richard D. Arlyce Dice, M.D.  ANESTHESIA:                   Local.  ESTIMATED BLOOD LOSS:         5 cc.  FINDINGS:                     A 5 mm pilar cyst in the right labia.  INDICATIONS:                  This is a 35 year old female who has been complaining of a cyst in her labia.  She has been evaluated in the office and has been followed for around three months.  The nature of the cyst was described to the patient and she was told this was certainly nothing to worry about, however, because the cyst persisted and was causing her problems, the decision was made to excise it.  It was felt that excising it in the operating room would afford a better exposure and suturing if any problems, then attempting to do this in the office.  DESCRIPTION OF PROCEDURE:     The patient was taken to the operating room and placed in the supine position, 6 cc of 1% lidocaine was infiltrated into the tissues around the cyst.  A small scalpel blade was then used to incise the skin around the cyst, so that the cyst was removed intact.  The bed was then sutured with 4-0 Chromic cat gut suture.   The procedure was then terminated and the patient left the operating room in good condition. DD:  01/25/01 TD:  01/27/01 Job: [id number]

## 2011-02-24 NOTE — Discharge Summary (Signed)
   NAME:  Andrea Mcguire, Andrea Mcguire                         ACCOUNT NO.:  [id number]   MEDICAL RECORD NO.:  [id number]                   PATIENT TYPE:  INP   LOCATION:  9119                                 FACILITY:  [facility]   PHYSICIAN:  Randye Lobo, M.D.                DATE OF BIRTH:  Nov 19, 1975   DATE OF ADMISSION:  05/26/2002  DATE OF DISCHARGE:  05/29/2002                                 DISCHARGE SUMMARY   FINAL DIAGNOSES:  1. Intrauterine pregnancy at term.  2. Breech presentation.   PROBLEM:  Primary low transverse cesarean section.   SURGEON:  Ilda Mori, M.D.   ASSESSMENT:  [name], M.D.   COMPLICATIONS:  None.   HISTORY:  This 35 year old G3, P1 was noted to have a breech presentation  around [redacted] weeks gestation. Option of external cephalic version was discussed  with the patient.  This was discouraged because the feet appeared to be deep  in the pelvis.  The patient also did have borderline oligohydramnios and the  patient elected to proceed with cesarean section.  She was taken to the  operating room on May 26, 2002 where a primary low transverse cesarean  section was performed with the delivery of a 7 pound 4 ounce infant with  Apgars of 9 and 9.  Delivery was without complications. The patient's  postoperative course was benign without significant fevers.  The patient was  felt ready for discharge on postoperative day #3. She was sent home on a  regular diet and told to decrease activities, told to continue prenatal  vitamins and ferrous sulfate, was given a prescription for Darvocet-N 100  one every four to six hours as needed for pain.  Told she could also use  over-the-counter ibuprofen and was to follow up in the office in four weeks.     Michelle B. Bigelman, P.A.-C.             Randye Lobo, M.D.    MBB/MEDQ  D:  07/03/2002  T:  07/03/2002  Job:  [id number] [id number]

## 2011-02-24 NOTE — H&P (Signed)
   Andrea MaizesRHIANN, BOUCHER NO.:  [id number]   MEDICAL RECORD NO.:  [id number]                   PATIENT TYPE:   LOCATION:                                       FACILITY:   PHYSICIAN:  Ilda Mori, M.D.                DATE OF BIRTH:   DATE OF ADMISSION:  05/26/2002  DATE OF DISCHARGE:                                HISTORY & PHYSICAL   CHIEF COMPLAINT:  Intrauterine pregnancy, breech presentation.   HISTORY:  The patient is 35 years old, gravida 2, para 1, and was noted to  have a breech presentation at [redacted] weeks gestation.  The patient was informed  of this situation and options of external cephalic version was discussed.  The patient had borderline polyhydramnios and the presenting part was deep  in the pelvis and because of these factors, the patient opted not to attempt  an external cephalic version.  The decision was made to proceed with  cesarean delivery.  The relevant past history, social, and family histories  are all in the obstetrical admission forms that were done at the office, and  there has been no change at all in this recorded findings since they were  done.   REVIEW OF SYSTEMS:  Negative.   SUMMARY OF PATIENT'S PSYCHOSOCIAL NEEDS:  Negative.   RELEVANT PHYSICAL EXAMINATION:  The patient is in a breech presentation.  The other physical findings are unchanged from the [facility] records that  are in the patient's chart.   CONCLUSION:  This patient is pregnant, has a breech presentation, and based  upon all factors, the course of action is to proceed with cesarean section.                                               Ilda Mori, M.D.    RK/MEDQ  D:  08/31/2002  T:  08/31/2002  Job:  [id number]

## 2011-02-24 NOTE — Op Note (Signed)
NAME:  Andrea Mcguire, Andrea Mcguire                         ACCOUNT NO.:  [id number]   MEDICAL RECORD NO.:  [id number]                   PATIENT TYPE:  INP   LOCATION:  9119                                 FACILITY:  [facility]   PHYSICIAN:  Ilda Mori, MD                  DATE OF BIRTH:  [birth year]   DATE OF PROCEDURE:  DATE OF DISCHARGE:                                 OPERATIVE REPORT   PREOPERATIVE DIAGNOSES:  1. Term pregnancy.  2. Breech presentation.   POSTOPERATIVE DIAGNOSES:  1. Term pregnancy.  2. Breech presentation.   PROCEDURE:  Primary low transverse cesarean section.   SURGEON:  Ilda Mori, MD   ASSISTANT:  [name], MD   ANESTHESIA:  Spinal.   ESTIMATED BLOOD LOSS:  800 cc.   FINDINGS:  Female infant, Apgar score 9 and 8.  Birth weight 7 pounds 4  ounces.  Normal appearing tubes and ovaries.   INDICATIONS:  This is a 35 year old gravida 3, para 1 who was noted to have  breech presentation at 45 weeks' gestation.  The options of external  cephalic version were discussed with the patient and this was discouraged  because the frank breech appeared to be deep in the pelvis, and she had  borderline oligohydramnios with amniotic fluid volumes between 5 and 7.  The  patient elected to proceed with cesarean section for delivery of the breech  infant.   DESCRIPTION OF PROCEDURE:  The patient was taken to the operating room.  Spinal anesthesia was placed.  She was placed in the supine position with  slight lateral displacement of the uterus.  The abdomen was prepped and  draped in a sterile fashion.  A low transverse incision was made and carried  down to the fascia which was then extended transversely.  The rectus muscles  were dissected from the overlying rectus sheath and divided in the midline.  The peritoneum was entered bluntly and extended vertically.  The lower  segment was then entered sharply and extended with bandage scissors.  The  infant was delivered  in the frank breech position without difficulty.  Cord  bloods were obtained.  The placenta was then extracted with retraction on  the cord.  The uterus was bluntly curettaged and delivered from the  peritoneal cavity.  The lower uterine segment was closed with a single  interlocking 0 Vicryl suture.  Hemostasis was obtained with vertical  mattress sutures.  After hemostasis was noted to  be present, the uterus was then placed back in the peritoneal cavity.  The  muscle and fascia were closed in the midline with a running 3-0 Vicryl  suture.  The fascia was closed with a running 0 Vicryl suture.  The skin was  closed with staples.  The patient tolerated the procedure well and left the  operating room in good condition.  Ilda Mori, MD    RK/MEDQ  D:  05/26/2002  T:  05/27/2002  Job:  [id number]

## 2011-02-24 NOTE — Discharge Summary (Signed)
[facility]  Patient:    Andrea Mcguire, Andrea Mcguire Visit Number: [id number] MRN: [id number]          Service Type: OBS Location: 910D 9151 01 Attending Physician:  Osborn Coho Dictated by:   Caralyn Guile Arlyce Dice, M.D. Admit Date:  04/22/2002 Discharge Date: 04/25/2002                             Discharge Summary  FINAL DIAGNOSIS:              Preterm labor.  SECONDARY DIAGNOSES:          None.  PROCEDURE:                    IV tocolysis  DISCHARGE CONDITION:          Improved.  HISTORY OF PRESENT ILLNESS:   This is a 35 year old gravida 3, para 1 who was admitted at 33-1/[redacted] weeks gestation with uterine contractions.  The patient had been seen two times in the previous 48 hours with abdominal pains and in neither evaluation was it felt that she was in preterm labor.  On the present admission, she was having contractions and it was felt that her cervix was changing.  HOSPITAL COURSE:              She was therefore admitted and placed on IV magnesium sulfate for tocolysis.  She responded to this treatment very well. She was started on a course of betamethasone on the day of admission.  The patient was observed for 48 hours and received a 24 hour course of betamethasone and 24-48 hours of magnesium sulfate.  On the morning of the third hospital day, the patient was doing very well and her contractions were minimal.  The magnesium was discontinued at 8:30 a.m., and the patient was observed throughout the day.  At 6 p.m., she was re-evaluated.  Her internal os was closed.  Her external os was finger tip.   The cervix was only slightly effaced  An ultrasound showed that the cervix was 3.3 cm.  The patient was therefore felt to be ready for discharge.  He was discharged home and told to limit her activities and rest as much as possible.  She was given Procardia 10 mg to take three times daily and asked to return to the office in one week  for evaluation.  She was also told that if her contractions became closer together that she was to return to the hospitalization for evaluation.  LABORATORY DATA:              Her admission hemoglobin was 11.7, admission white count 11.5.  Her blood chemistries were all within normal limits.  A urinalysis 9was benign.  Beta strep was done and was negative.  Ultrasound was performed which revealed a composite gestational age of [redacted] weeks and 6 days which was consistent with her last menstrual period.  The estimated weight was 50-77 profile.  The cervical length as noted was 3.37 and this was done transabdominally.  At the time of the ultrasound which was on the morning of April 24, 2001, the baby was felt to be in the breech presentation.  Prior to discharge, she was re-examined as noted and her cervix had not changed.  It was felt that she was once again in the vertex presentation. Dictated by:   Caralyn Guile Arlyce Dice, M.D. Attending Physician:  Dareen Piano,  Netty Starring DD:  04/25/02 TD:  05/01/02 Job: [id number]

## 2011-02-24 NOTE — Op Note (Signed)
[facility]  Patient:    LAJOY, VANAMBURG                       MRN: [id number] Proc. Date: 10/09/00 Adm. Date:  [id number] Attending:  Osborn Coho                           Operative Report  PREOPERATIVE DIAGNOSIS:       Incomplete abortion.  POSTOPERATIVE DIAGNOSIS:      Complete spontaneous abortion.  PROCEDURE:                    Dilation and evacuation.  SURGEON:                      Richard D. Arlyce Dice, M.D.  ANESTHESIA:                   Paracervical block with IV sedation.  ESTIMATED BLOOD LOSS:         10 cc.  FINDINGS:                     The uterus was retroverted and top normal size. The uterine cavity was 8 cm in depth and appeared to empty to sharp and suction curettage.  INDICATIONS:                  This is a 35 year old gravida 2, para 1 whose last menstrual period was November 23, who presented with cramping.  The patient had been seen in triage on December 30 complaining of spotting and cramping, at which time a beta-subunit was done, which was 141 miu.  The patient was seen on December 31 in the office, where her uterus was retroverted, top normal size and she was bleeding moderately, and with a history of having bled heavily earlier in the day.  She was told that she had probably had a completed abortion and that no D&C was necessary.  She was urged to wait for resolution of her symptoms, which was expected.  The patient called early on January 1 complaining of cramping, although the bleeding was improved.  Once again, she was reassured that this was normal.  The patient called back later in the day and said the cramping was more severe.  The patient was told to come to the hospital emergency room.  Repeat blood test was 121, which showed less than 50% reduction in her quantitative level. Because of the slow resolution of the quantitative hCG and the patients persistent significant abdominal pain, the decision was  made to do a D&E to rule out retained products of conception.  DESCRIPTION OF PROCEDURE:     The patient was taken to the operating room and placed in the supine position.  IV sedation was administered and a paracervical block of 20 cc of 1% lidocaine was placed.  The anterior lip of the cervix was grasped with a single tooth tenaculum.  Pratt dilators were used to dilate the internal os to #21 [place].  A #7 suction curet was introduced into the endometrial cavity and the uterus was systematically suction curettaged.  Following this, a sharp curet was introduced and gently used to probe the uterine cavity to make sure that the extent of the uterus had been explored with the suction curet.  After it was satisfied that the cavity was empty, the procedure was terminated.  The patient left the operating room in good condition. DD:  10/09/00 TD:  10/09/00 Job: [id number]

## 2011-11-13 ENCOUNTER — Encounter: Payer: Self-pay | Admitting: Physician Assistant

## 2011-11-13 ENCOUNTER — Ambulatory Visit (INDEPENDENT_AMBULATORY_CARE_PROVIDER_SITE_OTHER): Payer: 59 | Admitting: Physician Assistant

## 2011-11-13 VITALS — BP 125/80 | HR 63 | Temp 98.4°F | Ht 64.0 in | Wt 126.0 lb

## 2011-11-13 DIAGNOSIS — M940 Chondrocostal junction syndrome [Tietze]: Secondary | ICD-10-CM

## 2011-11-13 MED ORDER — IBUPROFEN 800 MG PO TABS: 800.0000 mg | ORAL_TABLET | Freq: Three times a day (TID) | ORAL | Status: AC | PRN

## 2011-11-13 MED ORDER — CYCLOBENZAPRINE HCL 5 MG PO TABS: 5.0000 mg | ORAL_TABLET | Freq: Every evening | ORAL | Status: AC | PRN

## 2011-11-13 NOTE — Patient Instructions (Signed)
Start Ibuprofen 800mg  three times a day along with flexeril at night. Symptomatic care. Stop lifting weight until resolved. Call if not improving by Friday.   Costochondritis Costochondritis (Tietze syndrome), or costochondral separation, is a swelling and irritation (inflammation) of the tissue (cartilage) that connects your ribs with your breastbone (sternum). It may occur on its own (spontaneously), through damage caused by an accident (trauma), or simply from coughing or minor exercise. It may take up to 6 weeks to get better and longer if you are unable to be conservative in your activities. HOME CARE INSTRUCTIONS   Avoid exhausting physical activity. Try not to strain your ribs during normal activity. This would include any activities using chest, belly (abdominal), and side muscles, especially if heavy weights are used.   Use ice for 15 to 20 minutes per hour while awake for the first 2 days. Place the ice in a plastic bag, and place a towel between the bag of ice and your skin.   Only take over-the-counter or prescription medicines for pain, discomfort, or fever as directed by your caregiver.  SEEK IMMEDIATE MEDICAL CARE IF:   Your pain increases or you are very uncomfortable.   You have a fever.   You develop difficulty with your breathing.   You cough up blood.   You develop worse chest pains, shortness of breath, sweating, or vomiting.   You develop new, unexplained problems (symptoms).  MAKE SURE YOU:   Understand these instructions.   Will watch your condition.   Will get help right away if you are not doing well or get worse.  Document Released: 07/05/2005 Document Revised: 06/07/2011 Document Reviewed: 05/13/2008 [facility] Patient Information 2012 [place], [place].

## 2011-11-13 NOTE — Progress Notes (Signed)
  Subjective:    Patient ID: Andrea Mcguire, female    DOB: 1975-12-10, 36 y.o.   MRN: [id number]  HPI Last Saturday patient was having chest tightness and trouble breathing so she went to [facility] ER. Cardiac and Pulmonary work up was done and both checked out great. Patient was told to follow up with primary care doctor if symptoms not resolving. Patient presents today still complaining of chest tightness. She is not SOB or have any wheezing. She describes it as "feeling like I have pneumonia but with no other symptoms other than chest tightness". She does report that chest tightness is worse at night and better during the day. Paitent can not remember any specific injury or incident of pulled muscle. She does however works out and [employer] and had spent some of last week lifting and playing with her neices and nephews who are young. She denies any new work out. Tylenol helps pain. No fever, chills, night sweats, nausea, or vomiting. She has had a cough but it has been mild and without production.   Review of Systems     Objective:   Physical Exam  Constitutional: She is oriented to person, place, and time. She appears well-developed and well-nourished.  HENT:  Head: Normocephalic and atraumatic.  Right Ear: External ear normal.  Left Ear: External ear normal.  Nose: Nose normal.  Mouth/Throat: Oropharynx is clear and moist. No oropharyngeal exudate.  Eyes: Conjunctivae are normal.  Neck: Normal range of motion. Neck supple.  Cardiovascular: Normal rate, regular rhythm and normal heart sounds.   Pulmonary/Chest: Effort normal and breath sounds normal. She has no wheezes.  Musculoskeletal:       Tenderness to deep palpation over anterior chest wall in the upper quadrants. ROM of bilateral arms and waist are WNL.  Neurological: She is alert and oriented to person, place, and time.  Skin: Skin is warm and dry.  Psychiatric: She has a normal mood and affect. Her behavior is normal.            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 Stop lifting weights until symptoms resolve. Can use ice packs at night. Flexeril 5mg  to use at night. Call if not improving by Friday.

## 2012-06-24 ENCOUNTER — Ambulatory Visit: Payer: 59 | Admitting: Physician Assistant

## 2012-06-24 ENCOUNTER — Encounter: Payer: Self-pay | Admitting: Physician Assistant

## 2012-06-24 ENCOUNTER — Ambulatory Visit (INDEPENDENT_AMBULATORY_CARE_PROVIDER_SITE_OTHER): Payer: BC Managed Care – PPO | Admitting: Physician Assistant

## 2012-06-24 VITALS — BP 127/80 | HR 75 | Temp 98.3°F | Ht 64.0 in | Wt 121.0 lb

## 2012-06-24 DIAGNOSIS — J069 Acute upper respiratory infection, unspecified: Secondary | ICD-10-CM

## 2012-06-24 MED ORDER — AZITHROMYCIN 250 MG PO TABS: ORAL_TABLET | ORAL | Status: AC

## 2012-06-24 MED ORDER — FLUTICASONE PROPIONATE 50 MCG/ACT NA SUSP: 2.0000 | Freq: Every day | NASAL | Status: DC

## 2012-06-24 NOTE — Progress Notes (Signed)
  Subjective:    Patient ID: Andrea Mcguire, female    DOB: 1975-12-14, 36 y.o.   MRN: [id number]  HPI Patient is a 36 yo female who presents to the clinic achy, weak and congested. This has been ongoing for 5 days. She ran a fever of 100 the first two nights but not ran any since. She feels very congested but denies any sinus pressure, headache, or ear pain. She has had some sore throat and cough. Cough is slightly productive yellow sputum. Denies any SOB or wheezing. She has tried Sinus cold and cough with ibuprofen. It has helped some. Both of her kids were sick last week but cleared without abx.    Review of Systems     Objective:   Physical Exam  Constitutional: She is oriented to person, place, and time. She appears well-developed and well-nourished.  HENT:  Head: Normocephalic and atraumatic.  Right Ear: External ear normal.  Left Ear: External ear normal.       TM's are normal bilaterally. Negative for maxillary or sinus tenderness. Oropharynx presents with PND not swollen and minimally red.  Nasal turbinates are swollen and red.   Eyes: Conjunctivae normal are normal.  Neck: Normal range of motion. Neck supple. No thyromegaly present.  Cardiovascular: Normal rate, regular rhythm and normal heart sounds.   Pulmonary/Chest: Effort normal and breath sounds normal. She has no wheezes.  Lymphadenopathy:    She has no cervical adenopathy.  Neurological: She is alert and oriented to person, place, and time.  Skin: Skin is warm and dry.  Psychiatric: She has a normal mood and affect. Her behavior is normal.          Assessment & Plan:  URI- I suspect viral infection. Suggested trying Mucinex D twice a day drinking lots of water. Educated about trying Vit C and Zinc at beginning of illness. Gave nasal spray to use daily. Honey and delsym were recommended for cough. Zpak was given but told not to take until 2-3 more days and if still feeling bad then can take Zpak. Zpak was given  due to PCN allergy.

## 2012-06-24 NOTE — Patient Instructions (Addendum)
Mucinex D can take twice a day. Delsylm or honey or both for cough. Stay hydrated. Vitamin C and Zinc to help build immune system. If not improving in 2 days then can start Zpak.   Upper Respiratory Infection, Adult An upper respiratory infection (URI) is also known as the common cold. It is often caused by a type of germ (virus). Colds are easily spread (contagious). You can pass it to others by kissing, coughing, sneezing, or drinking out of the same glass. Usually, you get better in 1 or 2 weeks.  HOME CARE   Only take medicine as told by your doctor.   Use a warm mist humidifier or breathe in steam from a hot shower.   Drink enough water and fluids to keep your pee (urine) clear or pale yellow.   Get plenty of rest.   Return to work when your temperature is back to normal or as told by your doctor. You may use a face mask and wash your hands to stop your cold from spreading.  GET HELP RIGHT AWAY IF:   After the first few days, you feel you are getting worse.   You have questions about your medicine.   You have chills, shortness of breath, or brown or red spit (mucus).   You have yellow or brown snot (nasal discharge) or pain in the face, especially when you bend forward.   You have a fever, puffy (swollen) neck, pain when you swallow, or white spots in the back of your throat.   You have a bad headache, ear pain, sinus pain, or chest pain.   You have a high-pitched whistling sound when you breathe in and out (wheezing).   You have a lasting cough or cough up blood.   You have sore muscles or a stiff neck.  MAKE SURE YOU:   Understand these instructions.   Will watch your condition.   Will get help right away if you are not doing well or get worse.  Document Released: 03/13/2008 Document Revised: 09/14/2011 Document Reviewed: 01/30/2011 [facility] Patient Information 2012 [place], [place].

## 2013-03-14 ENCOUNTER — Encounter: Payer: Self-pay | Admitting: Physician Assistant

## 2013-03-14 ENCOUNTER — Ambulatory Visit (INDEPENDENT_AMBULATORY_CARE_PROVIDER_SITE_OTHER): Payer: BC Managed Care – PPO | Admitting: Physician Assistant

## 2013-03-14 VITALS — BP 129/89 | HR 71 | Wt 133.0 lb

## 2013-03-14 DIAGNOSIS — R42 Dizziness and giddiness: Secondary | ICD-10-CM

## 2013-03-14 DIAGNOSIS — H8110 Benign paroxysmal vertigo, unspecified ear: Secondary | ICD-10-CM

## 2013-03-14 MED ORDER — MECLIZINE HCL 50 MG PO TABS: 50.0000 mg | ORAL_TABLET | Freq: Three times a day (TID) | ORAL | Status: DC | PRN

## 2013-03-14 NOTE — Progress Notes (Addendum)
  Subjective:    Patient ID: Andrea Mcguire, female    DOB: 1976/06/06, 37 y.o.   MRN: [id number]  HPI Patient presents to the clinic with vertigo and ear discomfort. [facility] for ear infection. She started taking a zpak. Today she went to an ENT and they said her ears look great and to follow up with PCP. She has progressively gotten more dizzy and fuzzy headed. Worse with change in positions especically when trying to work out.  Denies any ear pain today. No fever, chills, n/v/d. Denies any neck injuries or sudden movements. She denies any allergy symptoms. Takes claritin but does not use nasal sprays. No vision changes and has not passed out.     Review of Systems     Objective:   Physical Exam  Constitutional: She is oriented to person, place, and time. She appears well-developed and well-nourished.  HENT:  Head: Normocephalic and atraumatic.  Right Ear: External ear normal.  Left Ear: External ear normal.  Nose: Nose normal.  Mouth/Throat: Oropharynx is clear and moist.  Eyes: Conjunctivae and EOM are normal. Pupils are equal, round, and reactive to light.  Neck: Normal range of motion. Neck supple.  Cardiovascular: Normal rate, regular rhythm and normal heart sounds.   Pulmonary/Chest: Effort normal and breath sounds normal.  Lymphadenopathy:    She has no cervical adenopathy.  Neurological: She is alert and oriented to person, place, and time. No cranial nerve deficit.  Positive Dix Hallpike maneuver with nystagmus looking to right.   Skin: Skin is warm and dry.  Psychiatric: She has a normal mood and affect. Her behavior is normal.          Assessment & Plan:  BPV- will check CMP for electrolye issues. Gave handout for home epley manuevers. Pt aware to do it 3-4 times a day. Gave antivert to combat dizziness. Call if not improving in next week or if symptoms worsening.

## 2013-03-14 NOTE — Patient Instructions (Addendum)

## 2013-03-15 LAB — COMPREHENSIVE METABOLIC PANEL
ALT: 10 U/L (ref 0–35)
AST: 14 U/L (ref 0–37)
Albumin: 4.5 g/dL (ref 3.5–5.2)
Alkaline Phosphatase: 28 U/L — ABNORMAL LOW (ref 39–117)
BUN: 9 mg/dL (ref 6–23)
CO2: 29 mEq/L (ref 19–32)
Calcium: 9.9 mg/dL (ref 8.4–10.5)
Chloride: 103 mEq/L (ref 96–112)
Creat: 0.62 mg/dL (ref 0.50–1.10)
Glucose, Bld: 84 mg/dL (ref 70–99)
Potassium: 4 mEq/L (ref 3.5–5.3)
Sodium: 139 mEq/L (ref 135–145)
Total Bilirubin: 0.9 mg/dL (ref 0.3–1.2)
Total Protein: 6.8 g/dL (ref 6.0–8.3)

## 2013-03-19 ENCOUNTER — Other Ambulatory Visit: Payer: Self-pay | Admitting: Physician Assistant

## 2013-03-19 MED ORDER — PREDNISONE 50 MG PO TABS: ORAL_TABLET | ORAL | Status: DC

## 2013-03-19 NOTE — Progress Notes (Signed)
Sent prednisone to pharm.

## 2013-06-04 ENCOUNTER — Encounter: Payer: Self-pay | Admitting: Physician Assistant

## 2013-06-04 ENCOUNTER — Ambulatory Visit (INDEPENDENT_AMBULATORY_CARE_PROVIDER_SITE_OTHER): Payer: BC Managed Care – PPO | Admitting: Physician Assistant

## 2013-06-04 VITALS — BP 114/70 | HR 64 | Wt 127.0 lb

## 2013-06-04 DIAGNOSIS — F411 Generalized anxiety disorder: Secondary | ICD-10-CM

## 2013-06-04 DIAGNOSIS — F4322 Adjustment disorder with anxiety: Secondary | ICD-10-CM

## 2013-06-04 LAB — TSH: TSH: 1.058 u[IU]/mL (ref 0.350–4.500)

## 2013-06-04 MED ORDER — LORAZEPAM 0.5 MG PO TABS: 0.5000 mg | ORAL_TABLET | Freq: Every evening | ORAL | Status: DC | PRN

## 2013-06-04 MED ORDER — SERTRALINE HCL 50 MG PO TABS: 50.0000 mg | ORAL_TABLET | Freq: Every day | ORAL | Status: DC

## 2013-06-04 NOTE — Progress Notes (Signed)
  Subjective:    Patient ID: Andrea Mcguire, female    DOB: [birth year], 37 y.o.   MRN: [id number]  HPI Patient is a 37 year old female who presents to the clinic with ongoing anxiety. Patient reports she has always been very anxious. She worries about all situations and everything. She tends to be very obsessive about her weight, the way she keeps her house, her children. She has had a recent stressor with her daughter leaving for college. She cannot sleep at night she is always thinking and she how where she say. She does exercise regularly which helps with her anxiety. She exercises at least 3-5 times a week. She denies any depression or suicidal or harmful thoughts. She has not had any GI symptoms or nausea or vomiting. She denies any heat or cold intolerances or weight issues. Patient does have lorazepam that she used very as needed for when she can't sleep or feels very stressed. She has had the same lorazepam 30 tablets for the last 2 years.    Review of Systems     Objective:   Physical Exam  Constitutional: She is oriented to person, place, and time. She appears well-developed and well-nourished.  HENT:  Head: Normocephalic and atraumatic.  Eyes: Conjunctivae are normal.  Neck: Normal range of motion. Neck supple.  Cardiovascular: Normal rate, regular rhythm and normal heart sounds.   Pulmonary/Chest: Effort normal and breath sounds normal. She has no wheezes.  Lymphadenopathy:    She has no cervical adenopathy.  Neurological: She is alert and oriented to person, place, and time. She has normal reflexes.  Skin: Skin is warm and dry.  Psychiatric: She has a normal mood and affect. Her behavior is normal.          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 Patient is very resistant to medication. I discussed all also check a TSH to make sure there is no thyroid abnormality causing increased anxiety. I did convince patient to  try Zoloft 25 mg for 7 days then increase to 50 mg. Patient aware that does take 4-6 weeks to get to therapeutic dose. Side effects of nausea, weight gain, decreased libido were discussed. Patient encouraged to call if worsening symptoms occur. Patient encouraged to continue going to church birth group. Continue exercising regularly. Also encouraged patient to consider self relaxation techniques such as long bath at night or regular massages. I also did give lorazepam for acute stressors or when cannot sleep. Followup in 6-8 weeks.   Spent 30 minutes with patient greater than 50% of encounter was spent counseling patient regarding behavioral ways to help with anxiety.

## 2013-07-28 ENCOUNTER — Encounter: Payer: Self-pay | Admitting: Physician Assistant

## 2013-07-28 ENCOUNTER — Ambulatory Visit (INDEPENDENT_AMBULATORY_CARE_PROVIDER_SITE_OTHER): Payer: BC Managed Care – PPO | Admitting: Physician Assistant

## 2013-07-28 VITALS — BP 117/74 | HR 63 | Wt 130.0 lb

## 2013-07-28 DIAGNOSIS — F4322 Adjustment disorder with anxiety: Secondary | ICD-10-CM

## 2013-07-28 DIAGNOSIS — F411 Generalized anxiety disorder: Secondary | ICD-10-CM

## 2013-07-28 MED ORDER — SERTRALINE HCL 50 MG PO TABS: 50.0000 mg | ORAL_TABLET | Freq: Every day | ORAL | Status: DC

## 2013-07-28 NOTE — Progress Notes (Signed)
  Subjective:    Patient ID: Andrea Mcguire, female    DOB: 07-04-1976, 37 y.o.   MRN: [id number]  HPI Patient presents to the clinic to follow up on anxiety and starting zoloft. Patient feels 90 percent better. Her husband loves the ways she acts now. He states she is back to normal. Her anxiety symptoms, chest pressure have almost completely gone away. She does have some side effects. She takes at bedtime but still can be a little nauseated in the morning, it is harder for her to climax, and she feels like she has gained some weight. She is up 3lbs from august. Her daughter is able to come home from school more which is helping. She continues to exercise regularly.   Review of Systems     Objective:   Physical Exam  Constitutional: She is oriented to person, place, and time. She appears well-developed and well-nourished.  HENT:  Head: Normocephalic and atraumatic.  Cardiovascular: Normal rate, regular rhythm and normal heart sounds.   Pulmonary/Chest: Effort normal and breath sounds normal.  Neurological: She is alert and oriented to person, place, and time.  Skin: Skin is warm and dry.  Psychiatric: She has a normal mood and affect. Her behavior is normal.          Assessment & Plan:  Anxiety/adjustment disorder with anxiety- GAD-7 was 4. And PHQ-9 was 1. Pt would like to continue with zoloft despite side effects since working so well. Refilled for 6 months. Discussed we could change SSRI.

## 2013-08-21 ENCOUNTER — Telehealth: Payer: Self-pay | Admitting: *Deleted

## 2013-08-21 NOTE — Telephone Encounter (Signed)
Pt called this morning & would like to switch her sertraline.  She states that you guys had discussed that at her last visit.

## 2013-08-22 ENCOUNTER — Other Ambulatory Visit: Payer: Self-pay | Admitting: Physician Assistant

## 2013-08-22 MED ORDER — CITALOPRAM HYDROBROMIDE 20 MG PO TABS: ORAL_TABLET | ORAL | Status: DC

## 2013-08-22 NOTE — Telephone Encounter (Signed)
Ok i sent celexa to pharmacy to try. It is in the same class. If having side effects with this medication will have to consider switching classes all together. Follow up in 6-8 weeks so that we can see how you are doing and make sure symptoms are controlled.

## 2013-08-22 NOTE — Telephone Encounter (Signed)
LMOM notifying pt of PA instructions & med change.

## 2013-09-02 ENCOUNTER — Telehealth: Payer: Self-pay | Admitting: *Deleted

## 2013-09-02 DIAGNOSIS — R110 Nausea: Secondary | ICD-10-CM

## 2013-09-02 MED ORDER — ONDANSETRON HCL 8 MG PO TABS: 8.0000 mg | ORAL_TABLET | Freq: Three times a day (TID) | ORAL | Status: DC | PRN

## 2013-09-02 NOTE — Telephone Encounter (Signed)
Pt called & states that she has been accidentally taking the old rx of zoloft instead of the celexa since sat.  She states that she is feeling really nauseated & is unsure of what to do next.  Please advise (she is Jade's pt)

## 2013-09-02 NOTE — Telephone Encounter (Signed)
Pt.notified

## 2013-09-02 NOTE — Telephone Encounter (Signed)
Stop zoloft immediately, can switch to celexa either today or tomorrow.  I'll call in zofran for nausea to [facility] on [address].

## 2013-09-22 ENCOUNTER — Telehealth: Payer: Self-pay | Admitting: *Deleted

## 2013-09-22 ENCOUNTER — Other Ambulatory Visit: Payer: Self-pay | Admitting: Physician Assistant

## 2013-09-22 MED ORDER — BUPROPION HCL ER (XL) 150 MG PO TB24: 150.0000 mg | ORAL_TABLET | Freq: Every day | ORAL | Status: DC

## 2013-09-22 NOTE — Telephone Encounter (Signed)
Ok we can try wellbutrin daily. I will send to pharmacy. Follow up in next 4 weeks.

## 2013-09-22 NOTE — Telephone Encounter (Signed)
Confirm the reason why zoloft was stopped was nausea? We can start something and see if helps but I would like to have conversation with patient and follow up.

## 2013-09-22 NOTE — Telephone Encounter (Signed)
Pt left message stating that she would like her med changed again; that what she's taking now isn't working.  Would you like for her to come in? Please advise

## 2013-09-22 NOTE — Telephone Encounter (Signed)
Spoke with pt & she states that starting on fri she started cutting the celexa in half & now feels a lot better; she doesn't feel as tired as she did on the whole 20mg .

## 2013-09-22 NOTE — Telephone Encounter (Signed)
Pt.notified

## 2013-09-30 ENCOUNTER — Other Ambulatory Visit: Payer: Self-pay | Admitting: Physician Assistant

## 2013-11-02 ENCOUNTER — Other Ambulatory Visit: Payer: Self-pay | Admitting: Physician Assistant

## 2013-12-11 ENCOUNTER — Other Ambulatory Visit: Payer: Self-pay | Admitting: *Deleted

## 2013-12-11 MED ORDER — CITALOPRAM HYDROBROMIDE 10 MG PO TABS: ORAL_TABLET | ORAL | Status: DC

## 2013-12-26 ENCOUNTER — Ambulatory Visit (INDEPENDENT_AMBULATORY_CARE_PROVIDER_SITE_OTHER): Payer: BC Managed Care – PPO | Admitting: Physician Assistant

## 2013-12-26 ENCOUNTER — Other Ambulatory Visit: Payer: Self-pay | Admitting: Physician Assistant

## 2013-12-26 ENCOUNTER — Encounter: Payer: Self-pay | Admitting: Physician Assistant

## 2013-12-26 VITALS — BP 116/74 | HR 62 | Wt 135.0 lb

## 2013-12-26 DIAGNOSIS — F3289 Other specified depressive episodes: Secondary | ICD-10-CM

## 2013-12-26 DIAGNOSIS — Z13220 Encounter for screening for lipoid disorders: Secondary | ICD-10-CM

## 2013-12-26 DIAGNOSIS — F329 Major depressive disorder, single episode, unspecified: Secondary | ICD-10-CM

## 2013-12-26 DIAGNOSIS — F32A Depression, unspecified: Secondary | ICD-10-CM

## 2013-12-26 DIAGNOSIS — Z79899 Other long term (current) drug therapy: Secondary | ICD-10-CM

## 2013-12-26 DIAGNOSIS — F411 Generalized anxiety disorder: Secondary | ICD-10-CM

## 2013-12-26 DIAGNOSIS — Z131 Encounter for screening for diabetes mellitus: Secondary | ICD-10-CM

## 2013-12-26 LAB — COMPLETE METABOLIC PANEL WITH GFR
ALT: 12 U/L (ref 0–35)
AST: 13 U/L (ref 0–37)
Albumin: 4.4 g/dL (ref 3.5–5.2)
Alkaline Phosphatase: 32 U/L — ABNORMAL LOW (ref 39–117)
BUN: 12 mg/dL (ref 6–23)
CO2: 28 mEq/L (ref 19–32)
Calcium: 9.4 mg/dL (ref 8.4–10.5)
Chloride: 102 mEq/L (ref 96–112)
Creat: 0.63 mg/dL (ref 0.50–1.10)
GFR, Est African American: >89 mL/min
GFR, Est Non African American: >89 mL/min
Glucose, Bld: 83 mg/dL (ref 70–99)
Potassium: 4 mEq/L (ref 3.5–5.3)
Sodium: 138 mEq/L (ref 135–145)
Total Bilirubin: 0.7 mg/dL (ref 0.2–1.2)
Total Protein: 6.6 g/dL (ref 6.0–8.3)

## 2013-12-26 LAB — LIPID PANEL
Cholesterol: 150 mg/dL (ref 0–200)
HDL: 62 mg/dL (ref >39)
LDL Cholesterol: 78 mg/dL (ref 0–99)
Total CHOL/HDL Ratio: 2.4 Ratio
Triglycerides: 52 mg/dL (ref <150)
VLDL: 10 mg/dL (ref 0–40)

## 2013-12-26 MED ORDER — CITALOPRAM HYDROBROMIDE 10 MG PO TABS: ORAL_TABLET | ORAL | Status: DC

## 2013-12-26 NOTE — Progress Notes (Signed)
   Subjective:    Patient ID: Andrea Mcguire, female    DOB: 1976/05/02, 38 y.o.   MRN: [id number]  HPI Patient is a 38 -year-old female who presents to the clinic to followup on anxiety and depression. She's been taking Celexa 10 mg daily. She has had significant improvement with depression and anxiety. She feels like it is working [employer]. She is concerned with a 6 pound weight gain. She works out 4-5 times a week as well as eats very clean. She is doing everything she can to combat the weight gain the concerned it will continue to increase. Patient reports no other side effects. Review of Systems     Objective:   Physical Exam  Constitutional: She is oriented to person, place, and time. She appears well-developed and well-nourished.  HENT:  Head: Normocephalic and atraumatic.  Cardiovascular: Normal rate, regular rhythm and normal heart sounds.   Pulmonary/Chest: Effort normal and breath sounds normal.  Neurological: She is alert and oriented to person, place, and time.  Psychiatric: She has a normal mood and affect. Her behavior is normal.          Assessment & Plan:  Depression/anxiety- PHQ-9 was 2. GAD-7 was 2. Discussed different options with patient as well as switching medication. At this point since medication so beneficial discussed decreasing to one half tab daily and see if she still gets the benefits of decreased anxiety as well as make it easier to lose her weight or not to gain weight. If she notices an increase in anxiety she could increase back up to 10 mg tab or come in to talk about switching medication. Lab slip was given to check kidney and liver function. Followup in 6 months.   Pt due for screening lipid gave pt lab slip.  Tdap discussed pt stated she would schedule CPE in near future to get then.   Spent 30 minutes with patient greater than 50% of visit was spent counseling patient regarding all medication changes with depression and anxiety.

## 2014-02-19 ENCOUNTER — Other Ambulatory Visit: Payer: Self-pay | Admitting: *Deleted

## 2014-02-19 MED ORDER — CITALOPRAM HYDROBROMIDE 10 MG PO TABS: ORAL_TABLET | ORAL | Status: DC

## 2014-04-13 ENCOUNTER — Other Ambulatory Visit: Payer: Self-pay | Admitting: *Deleted

## 2014-04-13 ENCOUNTER — Telehealth: Payer: Self-pay | Admitting: *Deleted

## 2014-04-13 ENCOUNTER — Other Ambulatory Visit: Payer: Self-pay | Admitting: Physician Assistant

## 2014-04-13 MED ORDER — BUPROPION HCL ER (XL) 150 MG PO TB24: 150.0000 mg | ORAL_TABLET | Freq: Every day | ORAL | Status: DC

## 2014-04-13 NOTE — Telephone Encounter (Signed)
Ok i sent wellbutrin once daily in am to pharmacy. Follow up in 4-6 weeks to discuss if helping and no side effects. Call if any changes in worsening anxiety or depression. Stop celexa.

## 2014-04-13 NOTE — Telephone Encounter (Signed)
Pt left vm Thurs stating that you guys discussed her possibly changing medication from celexa to wellbutrin due to weight gain.  She stated that she would like to go ahead and change.

## 2014-04-13 NOTE — Telephone Encounter (Signed)
Pt.notified

## 2014-04-20 ENCOUNTER — Telehealth: Payer: Self-pay | Admitting: *Deleted

## 2014-04-20 MED ORDER — FLUOXETINE HCL 10 MG PO CAPS: ORAL_CAPSULE | ORAL | Status: DC

## 2014-04-20 NOTE — Telephone Encounter (Signed)
Andrea Mcguire calls this morning stating that since changing from celexa to wellbutrin a week ago she has became more anxious, she can hardly sleep at night, and actually had to leave work today because she can't stop crying.  She switched to wellbutrin because of weight gain but she loved how she felt on the celexa. She wants to know if she still needs to take it for a little longer for it to help or if she should just go back to the celexa & "be fat".  She stated that she took her last lorazepam last night & wants to know if she could have a refill on that as well. Please advise.

## 2014-04-20 NOTE — Telephone Encounter (Signed)
Can decrease the Wellbutrin to every other day for the next 6 days. I will have incentive her prescription for generic Prozac and we will try this. Keep regular scheduled appointment with a [facility].

## 2014-04-20 NOTE — Telephone Encounter (Signed)
Has she ever taken fluoxetine which is generic for Prozac?

## 2014-04-20 NOTE — Telephone Encounter (Signed)
Pt states that she has never tried prozac.

## 2014-04-21 NOTE — Telephone Encounter (Signed)
Pt notified of new rx.

## 2014-05-07 ENCOUNTER — Telehealth: Payer: Self-pay | Admitting: *Deleted

## 2014-05-07 ENCOUNTER — Other Ambulatory Visit: Payer: Self-pay | Admitting: *Deleted

## 2014-05-07 MED ORDER — LORAZEPAM 0.5 MG PO TABS: 0.5000 mg | ORAL_TABLET | Freq: Every evening | ORAL | Status: DC | PRN

## 2014-05-07 NOTE — Telephone Encounter (Signed)
Ok to refill celexa. It worked she just didn't like weight gain. Ok for lorazepam as well. Follow up in 6 weeks or sooner if wants to talk about other medication changes.

## 2014-05-07 NOTE — Telephone Encounter (Signed)
Done

## 2014-05-07 NOTE — Progress Notes (Signed)
Pt notified.  Lorazepam refilled.

## 2014-05-07 NOTE — Telephone Encounter (Signed)
Pt left vm stating that the prozac that [place] put her on a couple weeks ago isn't working so she has started herself back on the Celexa.  There are phone notes on the wellbutrin not working for her and that's why she was given the prozac.  She's also asking for a refill on her lorazepam.  Are you ok with me filling this right now & also would you like for her to come in since we've done a bunch of med changes? Please advise.

## 2014-05-20 ENCOUNTER — Ambulatory Visit (INDEPENDENT_AMBULATORY_CARE_PROVIDER_SITE_OTHER): Payer: BC Managed Care – PPO | Admitting: Physician Assistant

## 2014-05-20 ENCOUNTER — Encounter: Payer: Self-pay | Admitting: Physician Assistant

## 2014-05-20 VITALS — BP 118/81 | HR 70 | Ht 64.0 in | Wt 137.0 lb

## 2014-05-20 DIAGNOSIS — F411 Generalized anxiety disorder: Secondary | ICD-10-CM

## 2014-05-20 DIAGNOSIS — R638 Other symptoms and signs concerning food and fluid intake: Secondary | ICD-10-CM

## 2014-05-20 DIAGNOSIS — T50905A Adverse effect of unspecified drugs, medicaments and biological substances, initial encounter: Secondary | ICD-10-CM | POA: Insufficient documentation

## 2014-05-20 DIAGNOSIS — R635 Abnormal weight gain: Secondary | ICD-10-CM | POA: Insufficient documentation

## 2014-05-20 MED ORDER — CITALOPRAM HYDROBROMIDE 10 MG PO TABS: 10.0000 mg | ORAL_TABLET | Freq: Every day | ORAL | Status: DC

## 2014-05-20 NOTE — Progress Notes (Signed)
   Subjective:    Patient ID: Andrea Mcguire, female    DOB: 02-15-1976, 38 y.o.   MRN: [id number]  HPI Pt presents to the clinic to get a medication refill on celexa. She continues to have weight gain side effect which is why she tried 2 different medication over last couple of months. Total weight gain is 10lbs. She lost it while off celexa but had to go back on it last week and is now feeling much better. She tried wellbutrin which made her feel crazy and prozac which did not help anxiety. She wants to know if she can go on diet pill with celexa.    Review of Systems  All other systems reviewed and are negative.      Objective:   Physical Exam  Constitutional: She is oriented to person, place, and time. She appears well-developed and well-nourished.  HENT:  Head: Normocephalic and atraumatic.  Cardiovascular: Normal rate, regular rhythm and normal heart sounds.   Pulmonary/Chest: Effort normal and breath sounds normal.  Neurological: She is alert and oriented to person, place, and time.  Skin: Skin is dry.  Psychiatric: She has a normal mood and affect. Her behavior is normal.          Assessment & Plan:  Anxiety/depression/weight gain/medication side effect- GAD-7 is 3. PHQ-9 was 3. added wellbutrin and prozac to intolerance list. At this point I think it is better to have controlled anxiety and be 10lbs lighter. Explained to pt she is still at BMI of 23. Discussed with pt decreasing appetite is not necessarily going to cause weight loss while on celexa.  She continues to work out regularly and eat a very healthy diet. Discussed after 4-6 weeks of celexa if she feels controlled can try cutting in half and seeing if lower dose equals less side effects. Follow up in 2 months.

## 2014-09-28 ENCOUNTER — Ambulatory Visit (INDEPENDENT_AMBULATORY_CARE_PROVIDER_SITE_OTHER): Payer: BC Managed Care – PPO | Admitting: Physician Assistant

## 2014-09-28 ENCOUNTER — Encounter: Payer: Self-pay | Admitting: Physician Assistant

## 2014-09-28 VITALS — BP 122/72 | HR 68 | Ht 64.0 in | Wt 139.0 lb

## 2014-09-28 DIAGNOSIS — R635 Abnormal weight gain: Secondary | ICD-10-CM

## 2014-09-28 DIAGNOSIS — F411 Generalized anxiety disorder: Secondary | ICD-10-CM | POA: Diagnosis not present

## 2014-09-28 DIAGNOSIS — T50905A Adverse effect of unspecified drugs, medicaments and biological substances, initial encounter: Secondary | ICD-10-CM

## 2014-09-28 MED ORDER — LORAZEPAM 0.5 MG PO TABS: 0.5000 mg | ORAL_TABLET | Freq: Every evening | ORAL | Status: DC | PRN

## 2014-09-28 MED ORDER — VILAZODONE HCL 10 MG PO TABS: 10.0000 mg | ORAL_TABLET | Freq: Every day | ORAL | Status: DC

## 2014-09-28 NOTE — Progress Notes (Signed)
   Subjective:    Patient ID: Andrea Mcguire, female    DOB: [birth year], 38 y.o.   MRN: [id number]  HPI  Pt is here to follow up on anxiety and celexa. She is down to 5mg  of celexa daily because since starting she has gained 12lbs. She works out every day. She runs 5 times a week. She only eats one meal. Her anxiety is ok but having to use a little more ativan with decrease. She does not want to gain weight. She has tried zoloft and wellbutrin and did not tolerate.   Review of Systems  All other systems reviewed and are negative.      Objective:   Physical Exam  Constitutional: She appears well-developed and well-nourished.  HENT:  Head: Normocephalic and atraumatic.  Cardiovascular: Normal rate, regular rhythm and normal heart sounds.   Pulmonary/Chest: Effort normal and breath sounds normal. She has no wheezes.  Psychiatric: She has a normal mood and affect. Her behavior is normal.          Assessment & Plan:  Weight gain due to medication/anxiety- GAD-7 was 7 and PHQ-9 was 2. BMI 23 does not qualify for weight loss medication.  will try switch from celexa to viibryd. Perhaps will be less weight side effects. Pt did have sexual side effects as well with higher levels of celexa.  Sent 10mg  to pharmacy. Pt wants lowest dose. Discussed side effects. If not tolerated or still having weight gain could try buspar bid. Discussed with pt what I didn't want her to do is keep increasing ativan. Only use as needed. Pt is also only eating one meal a day this could be working against her. Discussed small frequent meals to aid in weight loss.

## 2014-11-09 ENCOUNTER — Ambulatory Visit (INDEPENDENT_AMBULATORY_CARE_PROVIDER_SITE_OTHER): Payer: BLUE CROSS/BLUE SHIELD | Admitting: Physician Assistant

## 2014-11-09 ENCOUNTER — Ambulatory Visit (INDEPENDENT_AMBULATORY_CARE_PROVIDER_SITE_OTHER): Payer: BLUE CROSS/BLUE SHIELD

## 2014-11-09 ENCOUNTER — Encounter: Payer: Self-pay | Admitting: Physician Assistant

## 2014-11-09 VITALS — BP 142/89 | HR 72 | Ht 64.0 in | Wt 136.0 lb

## 2014-11-09 DIAGNOSIS — R0789 Other chest pain: Secondary | ICD-10-CM

## 2014-11-09 DIAGNOSIS — Z86711 Personal history of pulmonary embolism: Secondary | ICD-10-CM | POA: Diagnosis not present

## 2014-11-09 DIAGNOSIS — R0602 Shortness of breath: Secondary | ICD-10-CM | POA: Diagnosis not present

## 2014-11-09 DIAGNOSIS — F411 Generalized anxiety disorder: Secondary | ICD-10-CM | POA: Diagnosis not present

## 2014-11-09 LAB — CBC WITH DIFFERENTIAL/PLATELET
Basophils Absolute: 0 10*3/uL (ref 0.0–0.1)
Basophils Relative: 0 % (ref 0–1)
Eosinophils Absolute: 0.1 10*3/uL (ref 0.0–0.7)
Eosinophils Relative: 1 % (ref 0–5)
HCT: 44.6 % (ref 36.0–46.0)
Hemoglobin: 14.8 g/dL (ref 12.0–15.0)
Lymphocytes Relative: 34 % (ref 12–46)
Lymphs Abs: 2.1 10*3/uL (ref 0.7–4.0)
MCH: 30.8 pg (ref 26.0–34.0)
MCHC: 33.2 g/dL (ref 30.0–36.0)
MCV: 92.8 fL (ref 78.0–100.0)
Monocytes Absolute: 0.3 10*3/uL (ref 0.1–1.0)
Monocytes Relative: 5 % (ref 3–12)
Neutro Abs: 3.7 10*3/uL (ref 1.7–7.7)
Neutrophils Relative %: 60 % (ref 43–77)
Platelets: 238 10*3/uL (ref 150–400)
RBC: 4.8 MIL/uL (ref 3.87–5.11)
RDW: 12.8 % (ref 11.5–15.5)
WBC: 6.2 10*3/uL (ref 4.0–10.5)

## 2014-11-09 LAB — D-DIMER, QUANTITATIVE: D-Dimer, Quant: 0.27 ug/mL-FEU (ref 0.00–0.48)

## 2014-11-09 LAB — COMPLETE METABOLIC PANEL WITH GFR
ALT: 10 U/L (ref 0–35)
AST: 12 U/L (ref 0–37)
Albumin: 4.5 g/dL (ref 3.5–5.2)
Alkaline Phosphatase: 31 U/L — ABNORMAL LOW (ref 39–117)
BUN: 10 mg/dL (ref 6–23)
CO2: 25 mEq/L (ref 19–32)
Calcium: 9.8 mg/dL (ref 8.4–10.5)
Chloride: 102 mEq/L (ref 96–112)
Creat: 0.61 mg/dL (ref 0.50–1.10)
GFR, Est African American: >89 mL/min
GFR, Est Non African American: >89 mL/min
Glucose, Bld: 102 mg/dL — ABNORMAL HIGH (ref 70–99)
Potassium: 4.1 mEq/L (ref 3.5–5.3)
Sodium: 140 mEq/L (ref 135–145)
Total Bilirubin: 0.9 mg/dL (ref 0.2–1.2)
Total Protein: 6.8 g/dL (ref 6.0–8.3)

## 2014-11-09 MED ORDER — ALBUTEROL SULFATE 108 (90 BASE) MCG/ACT IN AEPB: 2.0000 | INHALATION_SPRAY | RESPIRATORY_TRACT | Status: AC | PRN

## 2014-11-09 MED ORDER — LORAZEPAM 0.5 MG PO TABS: 0.5000 mg | ORAL_TABLET | Freq: Three times a day (TID) | ORAL | Status: DC | PRN

## 2014-11-09 MED ORDER — BUSPIRONE HCL 7.5 MG PO TABS: 7.5000 mg | ORAL_TABLET | Freq: Three times a day (TID) | ORAL | Status: DC

## 2014-11-09 MED ORDER — ALBUTEROL SULFATE (2.5 MG/3ML) 0.083% IN NEBU
2.5000 mg | INHALATION_SOLUTION | Freq: Once | RESPIRATORY_TRACT | Status: AC
  Administered 2014-11-09: 2.5 mg via RESPIRATORY_TRACT

## 2014-11-09 NOTE — Progress Notes (Signed)
   Subjective:    Patient ID: Andrea Mcguire, female    DOB: 04/09/1976, 39 y.o.   MRN: [id number]  HPI  Patient is a 39 year old female who presents to the clinic with shortness of breath, light headed feeling and anxiety. Wednesday approximately 6 days ago she admits to having an argument with her husband in initiating a panic attack. She took a Xanax and went to bed and seemed fine. Thursday she went out for a child and she was not able to run due to feeling like she was short of breath and in a days. The other day she ran out of breath just mopping the floors. When she feels short of breath like this it creates a lot of anxiety and she starts to cry and feel lightheaded. She hasn't taken more Ativan than usual over the past couple days feeling very anxious. This morning on the way to work she was sitting at a stoplight and she just felt very dizzy and like there was some chest tightness. She is worried because a couple of years back she did have a PE after a tummy tuck surgery and she felt similarly. Not taking any new medications. Did start viibryd but did not like it and restarted celexa. No hx of asthma. No cough or wheezing.     Review of Systems  All other systems reviewed and are negative.      Objective:   Physical Exam  Constitutional: She is oriented to person, place, and time. She appears well-developed and well-nourished.  HENT:  Head: Normocephalic and atraumatic.  Right Ear: External ear normal.  Left Ear: External ear normal.  Nose: Nose normal.  Mouth/Throat: Oropharynx is clear and moist.  Eyes: Conjunctivae are normal. Right eye exhibits no discharge. Left eye exhibits no discharge.  Neck: Normal range of motion. Neck supple.  Cardiovascular: Normal rate, regular rhythm and normal heart sounds.   Pulmonary/Chest: Effort normal and breath sounds normal. She has no wheezes.  Lymphadenopathy:    She has no cervical adenopathy.  Neurological: She is alert and oriented to  person, place, and time.  Skin: Skin is dry.  Psychiatric:  Very tearful today.           Assessment & Plan:  SOB/chest tightness/anxiety/hx of PE- Peak flow red zone at 220. Effort is in question. Gave albuterol nebulizer in office. Pt did feel a little more open to breathe. Peak flows in 300 after nebulizer. Still not convinced there is an asthma component. i did give albuterol inhaler to use as needed.  EKG- NSR, NO ST elevation, no arrhthymias. Will get STAT CXR.  Will get STAT CBC, CMP, D-dimer. Pulse ox reassuring, no hx of surgery, OCP, drug induced, trauma, tachycardia, or cancer. If d-dimer positive will get CTA to rule out PE.  For anxiety: continue on celexa and ativan. Added buspar bid and can increase to tid.  Call if symptoms changing or worsening.

## 2014-12-02 NOTE — Addendum Note (Signed)
Addended by: Narda Rutherford on: 12/02/2014 06:55 AM   Modules accepted: Orders

## 2014-12-03 ENCOUNTER — Encounter (INDEPENDENT_AMBULATORY_CARE_PROVIDER_SITE_OTHER): Payer: Self-pay

## 2014-12-03 ENCOUNTER — Other Ambulatory Visit: Payer: Self-pay | Admitting: Physician Assistant

## 2014-12-03 ENCOUNTER — Ambulatory Visit (INDEPENDENT_AMBULATORY_CARE_PROVIDER_SITE_OTHER): Payer: No Typology Code available for payment source | Admitting: Licensed Clinical Social Worker

## 2014-12-03 ENCOUNTER — Telehealth: Payer: Self-pay | Admitting: *Deleted

## 2014-12-03 ENCOUNTER — Encounter (HOSPITAL_COMMUNITY): Payer: Self-pay | Admitting: Licensed Clinical Social Worker

## 2014-12-03 DIAGNOSIS — F439 Reaction to severe stress, unspecified: Secondary | ICD-10-CM

## 2014-12-03 DIAGNOSIS — F329 Major depressive disorder, single episode, unspecified: Secondary | ICD-10-CM

## 2014-12-03 DIAGNOSIS — F438 Other reactions to severe stress: Secondary | ICD-10-CM

## 2014-12-03 DIAGNOSIS — F32A Depression, unspecified: Secondary | ICD-10-CM

## 2014-12-03 DIAGNOSIS — F411 Generalized anxiety disorder: Secondary | ICD-10-CM
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
## 2014-12-03 NOTE — Telephone Encounter (Signed)
Kim from behavioral health called this morning requesting a referral be placed since pt is scheduled to come in today.

## 2014-12-11 ENCOUNTER — Encounter (HOSPITAL_COMMUNITY): Payer: Self-pay | Admitting: Physician Assistant

## 2014-12-11 ENCOUNTER — Ambulatory Visit (INDEPENDENT_AMBULATORY_CARE_PROVIDER_SITE_OTHER): Payer: No Typology Code available for payment source | Admitting: Physician Assistant

## 2014-12-11 ENCOUNTER — Ambulatory Visit (INDEPENDENT_AMBULATORY_CARE_PROVIDER_SITE_OTHER): Payer: No Typology Code available for payment source | Admitting: Licensed Clinical Social Worker

## 2014-12-11 VITALS — BP 118/81 | HR 63 | Ht 64.0 in | Wt 142.0 lb

## 2014-12-11 DIAGNOSIS — F331 Major depressive disorder, recurrent, moderate: Secondary | ICD-10-CM

## 2014-12-11 DIAGNOSIS — F4310 Post-traumatic stress disorder, unspecified: Secondary | ICD-10-CM

## 2014-12-11 DIAGNOSIS — F439 Reaction to severe stress, unspecified: Secondary | ICD-10-CM

## 2014-12-11 MED ORDER — TRAZODONE HCL 50 MG PO TABS: ORAL_TABLET | ORAL | Status: DC

## 2014-12-11 MED ORDER — CITALOPRAM HYDROBROMIDE 20 MG PO TABS: ORAL_TABLET | ORAL | Status: DC

## 2014-12-11 MED ORDER — BUSPIRONE HCL 10 MG PO TABS: ORAL_TABLET | ORAL | Status: DC

## 2014-12-11 MED ORDER — LAMOTRIGINE 25 MG PO TABS: ORAL_TABLET | ORAL | Status: DC

## 2014-12-11 NOTE — Psych (Signed)
   THERAPIST PROGRESS NOTE  Session Time: 1:00-1:45pm   Participation Level: Active  Behavioral Response: Well GroomedAlertAnxious  Type of Therapy: Individual Therapy  Treatment Goals addressed: Anxiety  Interventions: CBT  Suicidal/Homicidal: Denied both  Therapist Interventions:  Described how anxiety develops.  Educated her about fight or flight.  Discussed how sometimes you can identify triggers and other times you can't.  Discussed how avoidance is one way people tend to cope with anxiety.  Explained how the way you interpret a situation influences how you end up feeling as opposed to the situation itself.  Introduced patient to a Editor, commissioning.  Encouraged her to record some entries between now and next session.    Summary:  Acknowledged that she was not familiar with the concept of fight or flight.  Reported there are times when she is able to identify triggers while other times she can't.  Indicated that she understood how to use the Thought Log.  Was able to describe a recent situation which led to significant feelings of anger and identify thoughts she had in the situation.  Plan: Will review Thought Log if she brings it in for discussion as recommended.    Diagnosis: F43.10 Trauma and Stressor Related Disorder        Armandina Stammer 12/11/2014
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
## 2014-12-11 NOTE — Patient Instructions (Signed)
1. Continue all medication as ordered. 2. Call this office if you have any questions or concerns. 3. Continue to get regular exercise 3-5 times a week. 4. Continue to eat a healthy nutritionally balanced diet. 5. Continue to reduce stress and anxiety through activities such as yoga, mindfulness, meditation and or prayer. 6. Add Vitamin D3 (5000 iu.) 1 a day, also add B complex, take one a day. 7. Follow up as planned in 2-3 weeks.

## 2014-12-18 ENCOUNTER — Ambulatory Visit (INDEPENDENT_AMBULATORY_CARE_PROVIDER_SITE_OTHER): Payer: No Typology Code available for payment source | Admitting: Licensed Clinical Social Worker

## 2014-12-18 DIAGNOSIS — F4310 Post-traumatic stress disorder, unspecified: Secondary | ICD-10-CM | POA: Diagnosis not present

## 2014-12-18 DIAGNOSIS — F439 Reaction to severe stress, unspecified: Secondary | ICD-10-CM | POA: Diagnosis not present
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
## 2014-12-21 ENCOUNTER — Telehealth (HOSPITAL_COMMUNITY): Payer: Self-pay

## 2014-12-21 NOTE — Telephone Encounter (Signed)
Return call to pt. Pt states "she got sick on Friday night from taking a handful of pills of Buspar. Pt made herself throw up and pt's husband is currently watching her." Pt states is not she suicidal or having thoughts of hurting herself.   Per Dr. De Nurse, please advise pt to skip dose for tonight. Pt will follow up with Milta Deiters on 3/15 @ 1045. Pt verbally states understanding.

## 2014-12-21 NOTE — Telephone Encounter (Signed)
PT is feeling like a zombie on the medication. She doesn't remember what happened Friday night, she took a handful of pills of Buspar on Friday night and she doesn't completely remember doing it. She made herself throw up and her husband stayed with her and watched her to make sure she was okay.  Please call pt asap. She didn't take any last night (Lamictal or Buspar)

## 2014-12-22 ENCOUNTER — Ambulatory Visit: Payer: Self-pay | Admitting: Physician Assistant

## 2014-12-22 ENCOUNTER — Ambulatory Visit (INDEPENDENT_AMBULATORY_CARE_PROVIDER_SITE_OTHER): Payer: BLUE CROSS/BLUE SHIELD | Admitting: Physician Assistant

## 2014-12-22 ENCOUNTER — Encounter (HOSPITAL_COMMUNITY): Payer: Self-pay | Admitting: Physician Assistant

## 2014-12-22 VITALS — BP 122/83 | HR 71 | Ht 64.0 in | Wt 140.0 lb

## 2014-12-22 DIAGNOSIS — F439 Reaction to severe stress, unspecified: Secondary | ICD-10-CM

## 2014-12-22 DIAGNOSIS — T50901D Poisoning by unspecified drugs, medicaments and biological substances, accidental (unintentional), subsequent encounter: Secondary | ICD-10-CM

## 2014-12-22 DIAGNOSIS — F331 Major depressive disorder, recurrent, moderate: Secondary | ICD-10-CM | POA: Diagnosis not present

## 2014-12-22 NOTE — Progress Notes (Signed)
[facility] [phone] Progress Note  Andrea Mcguire [id number] 39 y.o.  12/22/2014 11:11 AM  Chief Complaint:  depression History of Present Illness: Patient notes that she feels that she had a "reaction" to some of the medication. But then notes that she had a glass of wine and then went home and took a "handful of pills" she stated was Buspar, because she wanted to go to sleep. She states that she has no reason for what she did, and feels that the medication made her "not in my right mind." She goes on to report that she felt much less anxiety last week, and did not have the chest tightness she reported but also that she felt over medicated and groggy.  Andrea Mcguire notes that her husband made her vomit and watched her through out the next day for adverse reactions.    Since that time Andrea Mcguire has discontinued both the Lamictal and the Buspar. Andrea Mcguire denies suicidal ideation or thoughts or plans as she has children and loves her job.    Suicidal Ideation: No Plan Formed: No Patient has means to carry out plan: No  Homicidal Ideation: No Plan Formed: No Patient has means to carry out plan: No  Review of Systems: Psychiatric: Agitation: No Hallucination: No Depressed Mood: Yes 5/10 Insomnia: No Hypersomnia: No Altered Concentration: No Feels Worthless: No Grandiose Ideas: No Belief In Special Powers: No New/Increased Substance Abuse: No Compulsions: No  Neurologic: Headache: yes Seizure: No Paresthesias: No  Past Medical Family, Social History:   Outpatient Encounter Prescriptions as of 12/22/2014  Medication Sig  . Albuterol Sulfate (PROAIR RESPICLICK) 229 (90 BASE) MCG/ACT AEPB Inhale 2 puffs into the lungs every 4 (four) hours as needed.  . citalopram (CELEXA) 20 MG tablet Take one tablet by mouth each day for depression.  Marland Kitchen levonorgestrel (MIRENA) 20 MCG/24HR IUD 1 each by Intrauterine route.  Marland Kitchen LORazepam (ATIVAN) 0.5 MG tablet Take 1 tablet (0.5 mg total) by mouth  every 8 (eight) hours as needed for anxiety.  . busPIRone (BUSPAR) 10 MG tablet Take one tablet each morning and each evening for anxiety. (Patient not taking: Reported on 12/22/2014)  . [DISCONTINUED] lamoTRIgine (LAMICTAL) 25 MG tablet Take one tablet each evening at bedtime for mood stabilization. (Patient not taking: Reported on 12/22/2014)  . [DISCONTINUED] traZODone (DESYREL) 50 MG tablet Take one tablet at bedtime for insomnia. (Patient not taking: Reported on 12/22/2014)    Past Psychiatric History/Hospitalization(s): Anxiety: Yes 4/10 Bipolar Disorder: No Depression: Yes Mania: No Psychosis: No Schizophrenia: No Personality Disorder: No Hospitalization for psychiatric illness: No History of Electroconvulsive Shock Therapy: No Prior Suicide Attempts: No  Physical Exam: Constitutional:  BP 122/83 mmHg  Pulse 71  Ht 5\' 4"  (1.626 m)  Wt 140 lb (63.504 kg)  BMI 24.02 kg/m2  General Appearance: alert, oriented, no acute distress  Musculoskeletal: Strength & Muscle Tone: within normal limits Gait & Station: normal Patient leans: N/A  Psychiatric: Speech (describe rate, volume, coherence, spontaneity, and abnormalities if any): normal  Thought Process (describe rate, content, abstract reasoning, and computation): normal  Associations: Coherent and Relevant  Thoughts: normal  Mental Status: Orientation: oriented to person, place and time/date Mood & Affect: depressed affect Attention Span & Concentration: fair  Medical Decision Making (Choose Three): New Problem, with no additional work-up planned (3)  Assessment: Axis I: MDD Plan:  1. Patient elected to stay on current dose of Celexa at 20mg  and follow up in 2-3 weeks. 2. She will call if she  has any further problems and continue to see S. Elisabeth Cara. 3. She is reminded to take Vitamin D3 and 1 Bcomplex each day. 4. There are no concerns for her safety at this time.  Truitt Cruey,  PA-C 12/22/2014

## 2014-12-22 NOTE — Patient Instructions (Signed)
1. Continue Celexa as ordered. 2. Call this office if you have any questions or concerns. 3. Continue to get regular exercise 3-5 times a week. 4. Continue to eat a healthy nutritionally balanced diet. 5. Continue to reduce stress and anxiety through activities such as yoga, mindfulness, meditation and or prayer. 6. Continue Vitamin D3 (5000) iu each day, and take 1 B complex each day as discussed. 7. Follow up as planned 2-3 weeks.

## 2014-12-31 ENCOUNTER — Ambulatory Visit (HOSPITAL_COMMUNITY): Payer: Self-pay | Admitting: Physician Assistant

## 2015-01-06 ENCOUNTER — Encounter (HOSPITAL_COMMUNITY): Payer: Self-pay | Admitting: Physician Assistant

## 2015-01-06 ENCOUNTER — Ambulatory Visit (INDEPENDENT_AMBULATORY_CARE_PROVIDER_SITE_OTHER): Payer: BLUE CROSS/BLUE SHIELD | Admitting: Physician Assistant

## 2015-01-06 VITALS — BP 106/74 | HR 64 | Ht 64.0 in | Wt 136.0 lb

## 2015-01-06 DIAGNOSIS — F329 Major depressive disorder, single episode, unspecified: Secondary | ICD-10-CM

## 2015-01-06 DIAGNOSIS — F439 Reaction to severe stress, unspecified: Secondary | ICD-10-CM

## 2015-01-06 DIAGNOSIS — F331 Major depressive disorder, recurrent, moderate: Secondary | ICD-10-CM

## 2015-01-06 MED ORDER — CITALOPRAM HYDROBROMIDE 20 MG PO TABS: ORAL_TABLET | ORAL | Status: DC

## 2015-01-06 MED ORDER — TRAZODONE HCL 50 MG PO TABS: 50.0000 mg | ORAL_TABLET | Freq: Every evening | ORAL | Status: DC | PRN

## 2015-01-06 NOTE — Patient Instructions (Signed)
1.  Celexa at 20mg  and follow up in 4 weeks. 2.  She will continue both Vitamin D3 and Bcomplex. 3. She will be aggressively planning and taking "personal time" for herself everyday to either research a trip to [place], or consider taking a class in something she has always been interested in but never tried. 4. She will discuss calmly taking a "time out" when discussions get heated, and prepare her husband for this by suggesting this in a direct manner when they are not in a discussion. 5. She will also be supportive in helping her 42 year old son and 66 year old daughters to be more responsible with chores and money by setting up a chore chart. 6. Follow up 1 month.
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
## 2015-01-07 ENCOUNTER — Ambulatory Visit (INDEPENDENT_AMBULATORY_CARE_PROVIDER_SITE_OTHER): Payer: BLUE CROSS/BLUE SHIELD | Admitting: Licensed Clinical Social Worker

## 2015-01-07 DIAGNOSIS — F4310 Post-traumatic stress disorder, unspecified: Secondary | ICD-10-CM

## 2015-01-07 DIAGNOSIS — F439 Reaction to severe stress, unspecified: Secondary | ICD-10-CM
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
## 2015-01-21 ENCOUNTER — Ambulatory Visit (INDEPENDENT_AMBULATORY_CARE_PROVIDER_SITE_OTHER): Payer: BLUE CROSS/BLUE SHIELD | Admitting: Licensed Clinical Social Worker

## 2015-01-21 DIAGNOSIS — F439 Reaction to severe stress, unspecified: Secondary | ICD-10-CM

## 2015-01-21 DIAGNOSIS — F4310 Post-traumatic stress disorder, unspecified: Secondary | ICD-10-CM

## 2015-01-21 NOTE — Psych (Signed)
   THERAPIST PROGRESS NOTE  Session Time: 4:00pm-5:00pm   Participation Level: Active  Behavioral Response: Well GroomedAlert Euthymic  Type of Therapy: Individual Therapy  Treatment Goals addressed: Anxiety  Interventions: DBT-mindfulness  Suicidal/Homicidal: Denied both  Therapist Interventions:  Introduced concept of mindfulness.  Explained how it can be useful to practice at times when you catch yourself thinking unhelpful thoughts or feeling overwhelmed.  Talked about how you can choose to do tasks in a mindful way.  Listened to a recording guiding patient through a mindful breathing exercise.  Described several other ways to practice being mindful.  Encouraged regular practice of mindfulness in addition to implementation during times of stress.           Summary:    Seemed to understand concepts presented.  Indicated she is receptive to using the skills during times when she is stressed.  Noted feeling more relaxed after the breathing exercise.   Reported "feeling better" lately.  She said, "I still worry a lot, but I haven't been as negative."  Still having crying spells about twice a week.       Plan:  Scheduled to return next week.  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

## 2015-01-25 ENCOUNTER — Ambulatory Visit (HOSPITAL_COMMUNITY): Payer: Self-pay | Admitting: Licensed Clinical Social Worker

## 2015-01-30 ENCOUNTER — Other Ambulatory Visit (HOSPITAL_COMMUNITY): Payer: Self-pay | Admitting: Physician Assistant

## 2015-02-02 ENCOUNTER — Ambulatory Visit (HOSPITAL_COMMUNITY): Payer: Self-pay | Admitting: Licensed Clinical Social Worker

## 2015-02-02 ENCOUNTER — Other Ambulatory Visit (HOSPITAL_COMMUNITY): Payer: Self-pay | Admitting: Physician Assistant

## 2015-02-04 ENCOUNTER — Other Ambulatory Visit: Payer: Self-pay | Admitting: Physician Assistant

## 2015-02-10 ENCOUNTER — Ambulatory Visit (HOSPITAL_COMMUNITY): Payer: Self-pay | Admitting: Licensed Clinical Social Worker

## 2015-03-26 ENCOUNTER — Encounter (HOSPITAL_COMMUNITY): Payer: Self-pay | Admitting: Physician Assistant

## 2015-03-26 ENCOUNTER — Ambulatory Visit (INDEPENDENT_AMBULATORY_CARE_PROVIDER_SITE_OTHER): Payer: BLUE CROSS/BLUE SHIELD | Admitting: Physician Assistant

## 2015-03-26 VITALS — BP 118/76 | HR 67 | Ht 64.0 in | Wt 141.0 lb

## 2015-03-26 DIAGNOSIS — T50905A Adverse effect of unspecified drugs, medicaments and biological substances, initial encounter: Secondary | ICD-10-CM

## 2015-03-26 DIAGNOSIS — R635 Abnormal weight gain: Secondary | ICD-10-CM

## 2015-03-26 DIAGNOSIS — F331 Major depressive disorder, recurrent, moderate: Secondary | ICD-10-CM

## 2015-03-26 MED ORDER — FLUOXETINE HCL 10 MG PO TABS: 10.0000 mg | ORAL_TABLET | Freq: Every day | ORAL | Status: DC

## 2015-03-26 MED ORDER — CITALOPRAM HYDROBROMIDE 10 MG PO TABS: ORAL_TABLET | ORAL | Status: DC

## 2015-03-26 NOTE — Patient Instructions (Signed)
1. Take all of your medications as discussed with your provider. (Please check your AVS, for the list.) 2. Call this office for any questions or problems. 3. Be sure to get plenty of rest and try for 7-9 hours of quality sleep each night. 4. Try to get regular exercise, at least 15-30 minutes each day.  A good walk will help tremendously! 5. Remember to do your mindfulness each day, breath deeply in and out, while having quiet reflection, prayer, meditation, or positive visualization. Unplug and turn off all electronic devices each day for your own personal time without interruption. This works! There are studies to back this up! 6. Be sure to take your B complex and Vitamin D3 each day. This will improve your overall wellbeing and boost your immune system as well. 7. Try to eat a nutritious healthy diet and avoid excessive alcohol and ALL tobacco products. 8. Be sure to keep all of your appointments with your outpatient therapist. If you do not have one, our office will be happy to assist you with this. 9. Be sure to keep your next follow up appointment in 3 weeks. 

## 2015-03-26 NOTE — Progress Notes (Signed)
  [facility] [phone] Progress Note  LOYALTY ARENTZ [id number] 39 y.o.  03/26/2015 3:55 PM  Chief Complaint:  depression History of Present Illness: Andrea Mcguire wants to discuss changing the Celexa due to her weight gain. She has done well but would like to try something else. She has had the Mirena out for about a month and feels that despite good exercise, recently ran a 5K, she continues to gain weight.  Suicidal Ideation: No Plan Formed: No Patient has means to carry out plan: No  Homicidal Ideation: No Plan Formed: No Patient has means to carry out plan: No  Review of Systems: Psychiatric: Agitation: No Hallucination: No Depressed Mood: Yes 5/10 Insomnia: No Hypersomnia: No Altered Concentration: No Feels Worthless: No Grandiose Ideas: No Belief In Special Powers: No New/Increased Substance Abuse: No Compulsions: No  Neurologic: Headache: yes Seizure: No Paresthesias: No  Past Medical Family, Social History:   Outpatient Encounter Prescriptions as of 03/26/2015  Medication Sig  . Albuterol Sulfate (PROAIR RESPICLICK) 542 (90 BASE) MCG/ACT AEPB Inhale 2 puffs into the lungs every 4 (four) hours as needed.  . citalopram (CELEXA) 20 MG tablet Take one tablet by mouth each day for depression.  . traZODone (DESYREL) 50 MG tablet Take 1 tablet (50 mg total) by mouth at bedtime as needed for sleep.  . citalopram (CELEXA) 10 MG tablet TAKE 1 TABLET (10 MG TOTAL) BY MOUTH DAILY. (Patient not taking: Reported on 03/26/2015)  . LORazepam (ATIVAN) 0.5 MG tablet Take 1 tablet (0.5 mg total) by mouth every 8 (eight) hours as needed for anxiety. (Patient not taking: Reported on 03/26/2015)   No facility-administered encounter medications on file as of 03/26/2015.    Past Psychiatric History/Hospitalization(s): Anxiety: Yes 4/10 Bipolar Disorder: No Depression: Yes Mania: No Psychosis: No Schizophrenia: No Personality Disorder: No Hospitalization for psychiatric illness:  No History of Electroconvulsive Shock Therapy: No Prior Suicide Attempts: No  Physical Exam: Constitutional:  BP 118/76 mmHg  Pulse 67  Ht 5\' 4"  (1.626 m)  Wt 141 lb (63.957 kg)  BMI 24.19 kg/m2  SpO2 97%  General Appearance: alert, oriented, no acute distress  Musculoskeletal: Strength & Muscle Tone: within normal limits Gait & Station: normal Patient leans: N/A  Psychiatric: Speech (describe rate, volume, coherence, spontaneity, and abnormalities if any): normal  Thought Process (describe rate, content, abstract reasoning, and computation): normal  Associations: Coherent and Relevant  Thoughts: normal  Mental Status: Orientation: oriented to person, place and time/date Mood & Affect: depressed affect Attention Span & Concentration: fair  Medical Decision Making (Choose Three): New Problem, with no additional work-up planned (3)  Assessment: Weight gain MDD stable    Axis I: MDD Plan:  1. Will decrease the Celexa to 10mg  po qd to stop. 2. Will initiate prozac 10mg  to cross titrate upwards. 3. Patient will meet with a nutritionist to further modify her diet. There is a good chance that she may not need medication once the Mirena clears her system as two side effects were weight gain and depression. 4. Will follow. Rx as written follow up 3 weeks. Andrea Mcguire. Andrea Mcguire RPAC 3:55 PM 03/26/2015

## 2015-04-20 ENCOUNTER — Ambulatory Visit (INDEPENDENT_AMBULATORY_CARE_PROVIDER_SITE_OTHER): Payer: BLUE CROSS/BLUE SHIELD | Admitting: Physician Assistant

## 2015-04-20 ENCOUNTER — Encounter (HOSPITAL_COMMUNITY): Payer: Self-pay | Admitting: Physician Assistant

## 2015-04-20 VITALS — BP 118/68 | HR 74 | Ht 64.0 in | Wt 140.0 lb

## 2015-04-20 DIAGNOSIS — R635 Abnormal weight gain: Secondary | ICD-10-CM

## 2015-04-20 DIAGNOSIS — F419 Anxiety disorder, unspecified: Secondary | ICD-10-CM | POA: Diagnosis not present

## 2015-04-20 DIAGNOSIS — F331 Major depressive disorder, recurrent, moderate: Secondary | ICD-10-CM

## 2015-04-20 DIAGNOSIS — T50905A Adverse effect of unspecified drugs, medicaments and biological substances, initial encounter: Secondary | ICD-10-CM

## 2015-04-20 MED ORDER — ESCITALOPRAM OXALATE 10 MG PO TABS: 10.0000 mg | ORAL_TABLET | Freq: Every day | ORAL | Status: DC

## 2015-04-20 NOTE — Patient Instructions (Signed)
1. Take all of your medications as discussed with your provider. (Please check your AVS, for the list.)  2. Call this office for any questions or problems.  3. Be sure to get plenty of rest and try for 7-9 hours of quality sleep each night.  4. Try to get regular exercise, at least 15-30 minutes each day.  A good walk will help tremendously!  5. Remember to do your mindfulness each day, breath deeply in and out, while having quiet reflection, prayer, meditation, or positive visualization. Unplug and turn off all electronic devices each day for your own personal time without interruption. This works! There are studies to back this up!  6. Be sure to take your B complex and Vitamin D3 each day. This will improve your overall wellbeing and boost your immune system as well.  Evidence based medicine supports that taking these two supplements will improve your overall mental health.  Vitamin D3 (5000 i.u.) take one per day. B complex take one per day.    7. Try to eat a nutritious healthy diet and avoid excessive alcohol and ALL tobacco products.  8. Be sure to keep all of your appointments with your outpatient therapist. If you do not have one, our office will be happy to assist you with this.  9. Be sure to keep your next follow up appointment 3 weeks.

## 2015-04-20 NOTE — Progress Notes (Signed)
[facility] MD Progress Note  04/20/2015 3:06 PM Andrea Mcguire  MRN:  [id number] Subjective:  Patient is in to follow up on her anxiety. She has been on the prozac for about 3 weeks and feels that her anxiety is worse, she feels anxious when she is driving to work, feels it in her chest, and feels that Prozac is not the medication for her and she would like to change. Her vacation is coming up soon and she would like to leave her anxiety behind.  Her husband stated that she just looks sad. Principal Problem: Anxiety Diagnosis:   Patient Active Problem List   Diagnosis Date Noted  . DEPRESSION, MILD [F32.9] 12/19/2010    Priority: High  . Anxiety state [F41.1] 11/09/2014    Priority: Medium  . History of pulmonary embolism [Z86.711] 11/09/2014  . Weight gain due to medication [R63.5] 05/20/2014  . Anxiety state, unspecified [F41.1] 06/04/2013  . VIRAL URI [J06.9] 11/02/2010  . ALLERGIC RHINITIS CAUSE UNSPECIFIED [J30.9] 07/27/2009  . ACUTE MAXILLARY SINUSITIS [J01.00] 12/21/2008  . FATIGUE [R53.81, R53.83] 12/08/2008  . PARESTHESIA [R20.9] 12/08/2008  . PLEURISY [R09.1] 11/04/2008  . 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 OTHER ACUTE SINUSITIS [J01.80] 09/24/2008  . PNEUMONIA, ORGANISM UNSPECIFIED [J18.9] 09/16/2008  . DYSPNEA [R06.02] 09/16/2008   Total Time spent with patient: 30 minutes   Past Medical History: No past medical history on file.  Past Surgical History  Procedure Laterality Date  . Cesarean section    . Cosmetic surgery     Family History:  Family History  Problem Relation Age of Onset  . Depression Mother   . Anxiety disorder Mother   . Depression Sister   . Anxiety disorder Sister    Social History:  History  Alcohol Use  . 2.4 oz/week  . 4 Glasses of wine per week     History  Drug Use No    History   Social History  . Marital Status: Single    Spouse Name: N/A  . Number of Children: N/A  . Years of Education: N/A    Social History Main Topics  . Smoking status: Never Smoker   . Smokeless tobacco: Never Used  . Alcohol Use: 2.4 oz/week    4 Glasses of wine per week  . Drug Use: No  . Sexual Activity: Not on file   Other Topics Concern  . None   Social History Narrative   Additional History:    Sleep: Fair  Appetite:  Fair   Assessment: increasing anxiety  Musculoskeletal: Strength & Muscle Tone: within normal limits Gait & Station: normal Patient leans: N/A   Psychiatric Specialty Exam: Physical Exam  ROS  Blood pressure 118/68, pulse 74, height 5\' 4"  (1.626 m), weight 140 lb (63.504 kg), SpO2 98 %.Body mass index is 24.02 kg/(m^2).  General Appearance: Neat and Well Groomed  Engineer, water::  Good  Speech:  Clear and Coherent  Volume:  Normal  Mood:  Dysphoric  Affect:  Appropriate and Congruent  Thought Process:  Coherent, Goal Directed, Intact, Linear and Logical  Orientation:  Full (Time, Place, and Person)  Thought Content:  WDL  Suicidal Thoughts:  No  Homicidal Thoughts:  No  Memory:  Immediate;   Good Recent;   Good Remote;   Good  Judgement:  Good  Insight:  Present  Psychomotor Activity:  Normal  Concentration:  Good  Recall:  Good  Fund of Knowledge:Good  Language: Good  Akathisia:  No  Handed:  Left  AIMS (if indicated):     Assets:  Communication Skills Desire for Improvement Financial Resources/Insurance Housing Intimacy Leisure Time Physical Health Resilience Social Support  ADL's:  Intact  Cognition: WNL  Sleep:        Current Medications: Current Outpatient Prescriptions  Medication Sig Dispense Refill  . Albuterol Sulfate (PROAIR RESPICLICK) 678 (90 BASE) MCG/ACT AEPB Inhale 2 puffs into the lungs every 4 (four) hours as needed. 1 each 0  . FLUoxetine (PROZAC) 10 MG tablet Take 1 tablet (10 mg total) by mouth daily. 30 tablet 2  . citalopram (CELEXA) 10 MG tablet TAKE 1 TABLET (10 MG TOTAL) BY MOUTH DAILY. (Patient not taking: Reported  on 03/26/2015) 30 tablet 1   No current facility-administered medications for this visit.    Lab Results: No results found for this or any previous visit (from the past 48 hour(s)).  Physical Findings: AIMS:  , ,  ,  ,    CIWA:    COWS:     Treatment Plan Summary: Medication management   Medical Decision Making:  Review of Psycho-Social Stressors (1) and Review of New Medication or Change in Dosage (2)  1. Will D/C prozac. 2. Will initiate Lexapro 10mg  po qd. 3. Patient will need to see a nutritionist for guidance to assist with her weight loss. 4. She will follow up with provider of choice in 3 weeks.  Marlane Hatcher. Vickye Astorino RPAC 3:06 PM 04/20/2015

## 2015-05-17 ENCOUNTER — Other Ambulatory Visit (HOSPITAL_COMMUNITY): Payer: Self-pay | Admitting: Physician Assistant

## 2015-05-21 ENCOUNTER — Telehealth (HOSPITAL_COMMUNITY): Payer: Self-pay | Admitting: *Deleted

## 2015-05-21 MED ORDER — ESCITALOPRAM OXALATE 10 MG PO TABS: 10.0000 mg | ORAL_TABLET | Freq: Every day | ORAL | Status: DC

## 2015-05-21 NOTE — Telephone Encounter (Signed)
Pt called for a refill for Lexapro. Per Dr. De Nurse, pt is authorized for a refill Lexapro 10, Qty 30. Prescription was sent to [facility]. Pt has a f/u appt on 05/27/15. Called and informed pt of prescrioption status. Pt verbalizes understanding.

## 2015-05-27 ENCOUNTER — Ambulatory Visit (HOSPITAL_COMMUNITY): Payer: Self-pay | Admitting: Psychiatry

## 2015-05-31 ENCOUNTER — Ambulatory Visit (HOSPITAL_COMMUNITY): Payer: Self-pay | Admitting: Psychiatry

## 2015-06-03 ENCOUNTER — Encounter (HOSPITAL_COMMUNITY): Payer: Self-pay | Admitting: Psychiatry

## 2015-06-03 ENCOUNTER — Ambulatory Visit (INDEPENDENT_AMBULATORY_CARE_PROVIDER_SITE_OTHER): Payer: BLUE CROSS/BLUE SHIELD | Admitting: Psychiatry

## 2015-06-03 VITALS — BP 124/60 | HR 65 | Ht 64.0 in | Wt 142.0 lb

## 2015-06-03 DIAGNOSIS — F331 Major depressive disorder, recurrent, moderate: Secondary | ICD-10-CM

## 2015-06-03 DIAGNOSIS — T50905A Adverse effect of unspecified drugs, medicaments and biological substances, initial encounter: Secondary | ICD-10-CM

## 2015-06-03 DIAGNOSIS — F439 Reaction to severe stress, unspecified: Secondary | ICD-10-CM

## 2015-06-03 DIAGNOSIS — R635 Abnormal weight gain: Secondary | ICD-10-CM

## 2015-06-03 DIAGNOSIS — F419 Anxiety disorder, unspecified: Secondary | ICD-10-CM

## 2015-06-03 DIAGNOSIS — F4310 Post-traumatic stress disorder, unspecified: Secondary | ICD-10-CM

## 2015-06-03 MED ORDER — ESCITALOPRAM OXALATE 10 MG PO TABS: 15.0000 mg | ORAL_TABLET | Freq: Every day | ORAL | Status: DC
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
## 2015-07-13 ENCOUNTER — Other Ambulatory Visit (HOSPITAL_COMMUNITY): Payer: Self-pay | Admitting: Psychiatry

## 2015-07-15 NOTE — Telephone Encounter (Signed)
Received fax from [facility] for medication request for Lexapro 10mg . Per Dr. De Nurse, pt will need to schedule an appt with the office.

## 2015-07-17 ENCOUNTER — Encounter: Payer: Self-pay | Admitting: Emergency Medicine

## 2015-07-17 ENCOUNTER — Emergency Department
Admission: EM | Admit: 2015-07-17 | Discharge: 2015-07-17 | Disposition: A | Discharge status: Home / Self Care | Payer: 59 | Source: Home / Self Care

## 2015-07-17 DIAGNOSIS — B029 Zoster without complications: Secondary | ICD-10-CM | POA: Diagnosis not present

## 2015-07-17 MED ORDER — VALACYCLOVIR HCL 1 G PO TABS: 1000.0000 mg | ORAL_TABLET | Freq: Three times a day (TID) | ORAL | Status: DC

## 2015-07-17 MED ORDER — HYDROCODONE-ACETAMINOPHEN 5-325 MG PO TABS: 2.0000 | ORAL_TABLET | ORAL | Status: DC | PRN

## 2015-07-17 NOTE — ED Notes (Signed)
Pt c/o rash on buttocks x5 days. Stayed in hotel last weekend. Rash is painful and spreading. Denies itching.
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
## 2015-07-17 NOTE — Discharge Instructions (Signed)

## 2015-07-21 ENCOUNTER — Ambulatory Visit (HOSPITAL_COMMUNITY): Payer: Self-pay | Admitting: Psychiatry

## 2015-07-22 ENCOUNTER — Other Ambulatory Visit (HOSPITAL_COMMUNITY): Payer: Self-pay | Admitting: Psychiatry

## 2015-07-26 ENCOUNTER — Ambulatory Visit (INDEPENDENT_AMBULATORY_CARE_PROVIDER_SITE_OTHER): Payer: 59 | Admitting: Physician Assistant

## 2015-07-26 VITALS — BP 128/82 | HR 63 | Wt 144.0 lb

## 2015-07-26 DIAGNOSIS — F4310 Post-traumatic stress disorder, unspecified: Secondary | ICD-10-CM

## 2015-07-26 DIAGNOSIS — R5381 Other malaise: Secondary | ICD-10-CM | POA: Diagnosis not present

## 2015-07-26 DIAGNOSIS — B029 Zoster without complications: Secondary | ICD-10-CM | POA: Diagnosis not present

## 2015-07-26 DIAGNOSIS — H6121 Impacted cerumen, right ear: Secondary | ICD-10-CM | POA: Diagnosis not present

## 2015-07-26 DIAGNOSIS — F331 Major depressive disorder, recurrent, moderate: Secondary | ICD-10-CM

## 2015-07-26 DIAGNOSIS — R5383 Other fatigue: Secondary | ICD-10-CM

## 2015-07-26 MED ORDER — ALPRAZOLAM 0.5 MG PO TABS: 0.5000 mg | ORAL_TABLET | Freq: Every evening | ORAL | Status: DC | PRN

## 2015-07-26 MED ORDER — HYDROXYZINE HCL 10 MG PO TABS: 10.0000 mg | ORAL_TABLET | Freq: Three times a day (TID) | ORAL | Status: DC | PRN

## 2015-07-27 ENCOUNTER — Encounter: Payer: Self-pay | Admitting: Physician Assistant

## 2015-07-27 DIAGNOSIS — Z8619 Personal history of other infectious and parasitic diseases: Secondary | ICD-10-CM | POA: Insufficient documentation

## 2015-07-27 NOTE — Progress Notes (Signed)
   Subjective:    Patient ID: Andrea Mcguire, female    DOB: 09-10-1976, 39 y.o.   MRN: [id number]  HPI Pt presents to the clinic to follow up on shingles dx on 07/17/15 from [facility]. She was started on valtrex. She has one more day left. Rash and pain has almost completely resolved; however, she feels completely drained. She has no energy. She wonders if anything else could be going on. She denies any cough, SOB, ear pain, sore throat. No abdominal pain.   She also would like for me to refill her depression medication she does not want to go back to [facility] downstairs. She feels better and the medication are causing her to gain weight. She still would like something that is not a controlled substance for anxiety.    Review of Systems  All other systems reviewed and are negative.      Objective:   Physical Exam  Constitutional: She appears well-developed and well-nourished.  HENT:  Head: Normocephalic and atraumatic.  Right Ear: External ear normal.  Left Ear: External ear normal.  Nose: Nose normal.  Mouth/Throat: Oropharynx is clear and moist. No oropharyngeal exudate.  Right TM with cerumen impaction after irrigation clear.  Left TM clear.   Negative for maxillary sinus tenderness to palpation.   Eyes: Conjunctivae are normal. Right eye exhibits no discharge. Left eye exhibits no discharge.  Neck: Normal range of motion. Neck supple.  Cardiovascular: Normal rate, regular rhythm and normal heart sounds.   Pulmonary/Chest: Effort normal and breath sounds normal.  Lymphadenopathy:    She has no cervical adenopathy.  Skin:     Psychiatric: She has a normal mood and affect. Her behavior is normal.          Assessment & Plan:  Shingles- finish valtrex. Reassured pt rash looks great and appears to have great pain control.   maliase and fatigue- unclear etiology. Perhaps a post-viral syndrome of fatigue. At this time will hold off on labs and work up. If continues to have problems  follow up.   Depression/PTSD- I will take over this. Refilled and continue lexapro daily and xanax as needed. Vistaril to try as needed for anxiety up to three times a day.   Right ear impaction- irrigation done today.

## 2015-07-27 NOTE — Telephone Encounter (Signed)
Pt will need to schedule an appt with office.

## 2015-08-11 ENCOUNTER — Telehealth: Payer: Self-pay | Admitting: *Deleted

## 2015-08-11 NOTE — Telephone Encounter (Signed)
Pt left vm today asking if you would be ok with giving her a month of Cymbalta to try.  She said you mentioned this at her last visit.  Please advise.

## 2015-08-11 NOTE — Telephone Encounter (Signed)
Ok to send cymbalta 30mg  daily #30 NRF and cut lexapro to 1/2 tablet for 7 days then stop lexapro and stay on cymbalta. Follow up in 1 month.

## 2015-08-12 ENCOUNTER — Other Ambulatory Visit: Payer: Self-pay | Admitting: *Deleted

## 2015-08-12 MED ORDER — DULOXETINE HCL 30 MG PO CPEP: 30.0000 mg | ORAL_CAPSULE | Freq: Every day | ORAL | Status: DC

## 2015-08-12 NOTE — Telephone Encounter (Signed)
Cymbalta sent.  Pt notified.

## 2015-09-08 ENCOUNTER — Other Ambulatory Visit: Payer: Self-pay | Admitting: Physician Assistant

## 2015-09-10 ENCOUNTER — Ambulatory Visit (INDEPENDENT_AMBULATORY_CARE_PROVIDER_SITE_OTHER): Payer: 59 | Admitting: Physician Assistant

## 2015-09-10 ENCOUNTER — Encounter: Payer: Self-pay | Admitting: Physician Assistant

## 2015-09-10 VITALS — BP 123/84 | HR 68 | Ht 64.0 in | Wt 147.0 lb

## 2015-09-10 DIAGNOSIS — G4700 Insomnia, unspecified: Secondary | ICD-10-CM

## 2015-09-10 DIAGNOSIS — F411 Generalized anxiety disorder: Secondary | ICD-10-CM | POA: Diagnosis not present

## 2015-09-10 DIAGNOSIS — F331 Major depressive disorder, recurrent, moderate: Secondary | ICD-10-CM

## 2015-09-10 DIAGNOSIS — F4310 Post-traumatic stress disorder, unspecified: Secondary | ICD-10-CM | POA: Diagnosis not present

## 2015-09-10 MED ORDER — DULOXETINE HCL 30 MG PO CPEP: ORAL_CAPSULE | ORAL | Status: DC

## 2015-09-10 MED ORDER — ALPRAZOLAM 0.5 MG PO TABS: 0.5000 mg | ORAL_TABLET | Freq: Every evening | ORAL | Status: DC | PRN

## 2015-09-10 MED ORDER — TRAZODONE HCL 50 MG PO TABS: 25.0000 mg | ORAL_TABLET | Freq: Every evening | ORAL | Status: DC | PRN

## 2015-09-10 NOTE — Progress Notes (Signed)
   Subjective:    Patient ID: Andrea Mcguire, female    DOB: [birth year], 38 y.o.   MRN: [id number]  HPI Pt is a 39 year old female to follow up on start of Cymbalta. She is doing much better. She has noticed a decrease in anxiety and depression she denies any side effects from Cymbalta. She denies any suicidal or homicidal thoughts. She feels like this is a good fit for her. She is taking on average one Xanax a day. She feels that this helps with her overall anxiety. She is to call some problems getting to sleep. Xanax to help with this. She has not tried anything else to help her sleep..   Review of Systems  All other systems reviewed and are negative.      Objective:   Physical Exam  Constitutional: She is oriented to person, place, and time. She appears well-developed and well-nourished.  HENT:  Head: Normocephalic and atraumatic.  Cardiovascular: Normal rate, regular rhythm and normal heart sounds.   Pulmonary/Chest: Effort normal and breath sounds normal. She has no wheezes.  Neurological: She is alert and oriented to person, place, and time.  Skin: Skin is dry.  Psychiatric: She has a normal mood and affect. Her behavior is normal.          Assessment & Plan:  MDD/anxiety/PTSD- PHQ-9 was 4. GAD-7 was 2. Refilled cymbalta for 6 months. Xanax as needed discussed abuse potential.   Insomnia- trazodone given to try so that she is not using xanax every night. Discussed relaxation techniques for bedtime.

## 2015-10-06 ENCOUNTER — Other Ambulatory Visit: Payer: Self-pay | Admitting: Physician Assistant

## 2015-11-15 ENCOUNTER — Ambulatory Visit (INDEPENDENT_AMBULATORY_CARE_PROVIDER_SITE_OTHER): Payer: 59 | Admitting: Physician Assistant

## 2015-11-15 ENCOUNTER — Encounter: Payer: Self-pay | Admitting: Physician Assistant

## 2015-11-15 VITALS — BP 125/68 | HR 70 | Ht 64.0 in | Wt 143.0 lb

## 2015-11-15 DIAGNOSIS — F331 Major depressive disorder, recurrent, moderate: Secondary | ICD-10-CM | POA: Diagnosis not present

## 2015-11-15 DIAGNOSIS — F411 Generalized anxiety disorder: Secondary | ICD-10-CM | POA: Diagnosis not present

## 2015-11-15 DIAGNOSIS — G4700 Insomnia, unspecified: Secondary | ICD-10-CM

## 2015-11-15 MED ORDER — DULOXETINE HCL 60 MG PO CPEP: 60.0000 mg | ORAL_CAPSULE | Freq: Every day | ORAL | Status: DC

## 2015-11-15 MED ORDER — ALPRAZOLAM 1 MG PO TABS: 1.0000 mg | ORAL_TABLET | Freq: Two times a day (BID) | ORAL | Status: DC | PRN

## 2015-11-15 NOTE — Progress Notes (Signed)
   Subjective:    Patient ID: Andrea Mcguire, female    DOB: May 28, 1976, 40 y.o.   MRN: [id number]  HPI Pt is a 40 yo female who presents to the clinic with worsening anxiety and depression. Her ex-husband passed away from brain cancer on December 8th. She has a daughter with him. He has been hard to see her so upset. Her current husband moved away for business. She is struggling work and personal life. She having trouble sleeping and trazodone did not help and make her have weird dreams. She is going to counseling with daughter. She is using xanax 2 .5mg  tabs at bedtime and one during the day and running out too soon. She needs refills. She increased cymbalta to 60mg  and does feel some better with the increase. She has not been exercising.    Review of Systems  All other systems reviewed and are negative.      Objective:   Physical Exam  Constitutional: She is oriented to person, place, and time. She appears well-developed and well-nourished.  HENT:  Head: Normocephalic and atraumatic.  Cardiovascular: Normal rate, regular rhythm and normal heart sounds.   Pulmonary/Chest: Effort normal and breath sounds normal.  Neurological: She is alert and oriented to person, place, and time.  Psychiatric: She has a normal mood and affect. Her behavior is normal.          Assessment & Plan:  MDD/GAD/insomnia- PHQ-9 was 10. GAD-7 was 11. Increased xanax to 1mg  up to twice a day for anxiety and sleep. Discussed this is only temporary while going through acute event.SE to trazodone. She did not want to try [place] or lunesta today.  Increased cymbalta to 60mg . Continue with counseling. Add back in exercise. Follow up in 68months I would like to then decrease xanax back down to .5mg .

## 2015-11-16 ENCOUNTER — Encounter: Payer: Self-pay | Admitting: Physician Assistant

## 2015-12-15 ENCOUNTER — Other Ambulatory Visit: Payer: Self-pay | Admitting: *Deleted

## 2015-12-15 MED ORDER — DULOXETINE HCL 60 MG PO CPEP: 60.0000 mg | ORAL_CAPSULE | Freq: Every day | ORAL | Status: DC

## 2016-01-03 ENCOUNTER — Telehealth: Payer: Self-pay

## 2016-01-03 NOTE — Telephone Encounter (Signed)
Pt has already tried and failed viibryd. She did not report problems at 30mg . Would she like to decrease back to 30mg ?

## 2016-01-03 NOTE — Telephone Encounter (Signed)
Salome complain that the Cymbalta is causing her to not have an organism. She would like to switch to something that would not cause low sex drive.

## 2016-01-04 ENCOUNTER — Other Ambulatory Visit: Payer: Self-pay | Admitting: Physician Assistant

## 2016-01-04 MED ORDER — DULOXETINE HCL 30 MG PO CPEP: 30.0000 mg | ORAL_CAPSULE | Freq: Every day | ORAL | Status: DC

## 2016-01-04 NOTE — Telephone Encounter (Signed)
Sent!

## 2016-01-04 NOTE — Telephone Encounter (Signed)
Patient would like to decrease to the 30 mg. She needs a new prescription.

## 2016-02-10 ENCOUNTER — Ambulatory Visit: Payer: Self-pay | Admitting: Family Medicine

## 2016-04-27 ENCOUNTER — Other Ambulatory Visit: Payer: Self-pay | Admitting: Physician Assistant

## 2016-04-27 MED ORDER — ALPRAZOLAM 1 MG PO TABS: 1.0000 mg | ORAL_TABLET | Freq: Two times a day (BID) | ORAL | Status: DC | PRN

## 2016-04-27 NOTE — Telephone Encounter (Signed)
Pt called office today requesting Rx for xanax. Pt was due to follow up with PCP in May. Pt states she has moved to [place] and has not set up with a new PCP yet. Questions if she can get a 30 day Rx to last until she can get a new PCP. Will route for review. Pharmacy updated in chart.

## 2016-06-18 ENCOUNTER — Other Ambulatory Visit: Payer: Self-pay | Admitting: Physician Assistant

## 2016-06-19 ENCOUNTER — Encounter: Payer: Self-pay | Admitting: Physician Assistant

## 2016-06-19 ENCOUNTER — Ambulatory Visit (INDEPENDENT_AMBULATORY_CARE_PROVIDER_SITE_OTHER): Payer: BLUE CROSS/BLUE SHIELD | Admitting: Physician Assistant

## 2016-06-19 VITALS — BP 139/95 | HR 78 | Ht 64.0 in | Wt 147.0 lb

## 2016-06-19 DIAGNOSIS — G4700 Insomnia, unspecified: Secondary | ICD-10-CM

## 2016-06-19 DIAGNOSIS — F411 Generalized anxiety disorder: Secondary | ICD-10-CM

## 2016-06-19 DIAGNOSIS — F331 Major depressive disorder, recurrent, moderate: Secondary | ICD-10-CM | POA: Diagnosis not present

## 2016-06-19 MED ORDER — VORTIOXETINE HBR 10 MG PO TABS: ORAL_TABLET | ORAL | 0 refills | Status: DC

## 2016-06-19 MED ORDER — ALPRAZOLAM 1 MG PO TABS: 1.0000 mg | ORAL_TABLET | Freq: Two times a day (BID) | ORAL | 0 refills | Status: DC | PRN

## 2016-06-19 NOTE — Patient Instructions (Signed)
Cut cymbalta to 30mg  for 7 days with trintelix 10mg  then stop cymbalta and increase trintellix to 20mg  daily.
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
## 2016-06-28 ENCOUNTER — Encounter: Payer: Self-pay | Admitting: Physician Assistant

## 2016-08-14 ENCOUNTER — Encounter: Payer: Self-pay | Admitting: Physician Assistant

## 2016-08-14 ENCOUNTER — Ambulatory Visit (INDEPENDENT_AMBULATORY_CARE_PROVIDER_SITE_OTHER): Payer: BLUE CROSS/BLUE SHIELD | Admitting: Physician Assistant

## 2016-08-14 VITALS — BP 117/62 | HR 70 | Ht 64.0 in | Wt 149.0 lb

## 2016-08-14 DIAGNOSIS — R4586 Emotional lability: Secondary | ICD-10-CM

## 2016-08-14 DIAGNOSIS — F39 Unspecified mood [affective] disorder: Secondary | ICD-10-CM | POA: Diagnosis not present

## 2016-08-14 DIAGNOSIS — R1012 Left upper quadrant pain: Secondary | ICD-10-CM

## 2016-08-14 DIAGNOSIS — F411 Generalized anxiety disorder: Secondary | ICD-10-CM

## 2016-08-14 DIAGNOSIS — F5101 Primary insomnia: Secondary | ICD-10-CM

## 2016-08-14 DIAGNOSIS — F331 Major depressive disorder, recurrent, moderate: Secondary | ICD-10-CM

## 2016-08-14 DIAGNOSIS — Z86711 Personal history of pulmonary embolism: Secondary | ICD-10-CM

## 2016-08-14 DIAGNOSIS — Z79899 Other long term (current) drug therapy: Secondary | ICD-10-CM | POA: Diagnosis not present

## 2016-08-14 DIAGNOSIS — F4310 Post-traumatic stress disorder, unspecified: Secondary | ICD-10-CM

## 2016-08-14 DIAGNOSIS — Z13220 Encounter for screening for lipoid disorders: Secondary | ICD-10-CM

## 2016-08-14 LAB — CBC WITH DIFFERENTIAL/PLATELET
Basophils Absolute: 0 cells/uL (ref 0–200)
Basophils Relative: 0 %
Eosinophils Absolute: 104 cells/uL (ref 15–500)
Eosinophils Relative: 2 %
HCT: 37.7 % (ref 35.0–45.0)
Hemoglobin: 12.9 g/dL (ref 11.7–15.5)
Lymphocytes Relative: 35 %
Lymphs Abs: 1820 cells/uL (ref 850–3900)
MCH: 30.7 pg (ref 27.0–33.0)
MCHC: 34.2 g/dL (ref 32.0–36.0)
MCV: 89.8 fL (ref 80.0–100.0)
MPV: 9.8 fL (ref 7.5–12.5)
Monocytes Absolute: 312 cells/uL (ref 200–950)
Monocytes Relative: 6 %
Neutro Abs: 2964 cells/uL (ref 1500–7800)
Neutrophils Relative %: 57 %
Platelets: 237 10*3/uL (ref 140–400)
RBC: 4.2 MIL/uL (ref 3.80–5.10)
RDW: 12.6 % (ref 11.0–15.0)
WBC: 5.2 10*3/uL (ref 3.8–10.8)

## 2016-08-14 LAB — COMPLETE METABOLIC PANEL WITH GFR
ALT: 11 U/L (ref 6–29)
AST: 12 U/L (ref 10–30)
Albumin: 4.3 g/dL (ref 3.6–5.1)
Alkaline Phosphatase: 30 U/L — ABNORMAL LOW (ref 33–115)
BUN: 11 mg/dL (ref 7–25)
CO2: 25 mmol/L (ref 20–31)
Calcium: 9.1 mg/dL (ref 8.6–10.2)
Chloride: 105 mmol/L (ref 98–110)
Creat: 0.59 mg/dL (ref 0.50–1.10)
GFR, Est African American: >89 mL/min (ref >=60)
GFR, Est Non African American: >89 mL/min (ref >=60)
Glucose, Bld: 85 mg/dL (ref 65–99)
Potassium: 4.2 mmol/L (ref 3.5–5.3)
Sodium: 139 mmol/L (ref 135–146)
Total Bilirubin: 0.7 mg/dL (ref 0.2–1.2)
Total Protein: 6.4 g/dL (ref 6.1–8.1)

## 2016-08-14 LAB — LIPID PANEL
Cholesterol: 194 mg/dL (ref <200)
HDL: 55 mg/dL (ref >50)
LDL Cholesterol: 126 mg/dL — ABNORMAL HIGH (ref <100)
Total CHOL/HDL Ratio: 3.5 Ratio (ref <5.0)
Triglycerides: 63 mg/dL (ref <150)
VLDL: 13 mg/dL (ref <30)

## 2016-08-14 LAB — TSH: TSH: 1.31 mIU/L

## 2016-08-14 LAB — FERRITIN: Ferritin: 31 ng/mL (ref 10–154)

## 2016-08-14 NOTE — Progress Notes (Signed)
   Subjective:    Patient ID: Andrea Mcguire, female    DOB: 1976-03-25, 40 y.o.   MRN: [id number]  HPI  Pt is a 40 yo female with a hx of MDD, GAD, insomnia, PTSD and mood changes. We had started on trintellix and pt started having reactions with rash and increased anxiety. She stopped trintellix. She decided to start seeing a psychiatrist. She is currently seeing a Social worker. She does think counseling is helping. She was put on zoloft again and so far she is doing well.   She wanted to get labs done for psychiatrist and see the medications we have tried here in clinic to give to him.   Pt would like a d-dimer due to her hx of PE after surgery. She is having episodic LUQ shooting pain. No nausea or vomiting. Nothing makes better or worse. No bowel changes.     Review of Systems See HPI.     Objective:   Physical Exam  Constitutional: She is oriented to person, place, and time. She appears well-developed and well-nourished.  HENT:  Head: Normocephalic and atraumatic.  Mouth/Throat: Oropharynx is clear and moist.  Cardiovascular: Normal rate, regular rhythm and normal heart sounds.   Pulmonary/Chest: Effort normal and breath sounds normal.  Negative CVA tenderness.   Abdominal: Soft. Bowel sounds are normal. She exhibits no distension and no mass. There is no tenderness. There is no rebound and no guarding.  Neurological: She is alert and oriented to person, place, and time.  Psychiatric: She has a normal mood and affect. Her behavior is normal.          Assessment & Plan:  Marland KitchenMarland KitchenDiagnoses and all orders for this visit:  Mood changes ([place]) -     TSH -     COMPLETE METABOLIC PANEL WITH GFR -     Estrogens, total -     Progesterone -     Follicle stimulating hormone -     Testosterone -     CBC with Differential/Platelet -     Ferritin -     LH  Screening for lipid disorders -     Lipid panel  Medication management -     COMPLETE METABOLIC PANEL WITH GFR -     D-Dimer,  Quantitative  History of pulmonary embolus (PE) -     D-Dimer, Quantitative  LUQ pain -     D-Dimer, Quantitative  MDD (major depressive disorder), recurrent episode, moderate (HCC)  PTSD (post-traumatic stress disorder)  GAD (generalized anxiety disorder)  Primary insomnia   Viibryd, prozac, zoloft, wellbutrin, trintellix, cymbalta, lexapro, celexa, wellbutrin- all had side effects or did not work.   buspar- she had tried but unclear if just didn't work or had a side effect.   Continue with counselor and psychiatrist.   LUQ pain- I do not suspect DVT but patient is concerned and wants d-dimer.

## 2016-08-14 NOTE — Patient Instructions (Signed)
Viibryd, prozac, zoloft, wellbutrin, trintellix, cymbalta, lexapro, celexa- intolerances.   Pt tried buspar and stopped due to not feeling like was beneficial.   Pt has tried lamictal but unsure of if there was a reaction.

## 2016-08-15 ENCOUNTER — Encounter: Payer: Self-pay | Admitting: Physician Assistant

## 2016-08-15 DIAGNOSIS — E7800 Pure hypercholesterolemia, unspecified: Secondary | ICD-10-CM | POA: Insufficient documentation

## 2016-08-15 DIAGNOSIS — R4586 Emotional lability: Secondary | ICD-10-CM | POA: Insufficient documentation

## 2016-08-15 LAB — LUTEINIZING HORMONE: LH: 6.3 m[IU]/mL

## 2016-08-15 LAB — D-DIMER, QUANTITATIVE: D-Dimer, Quant: <0.19 mcg/mL FEU (ref <0.50)

## 2016-08-15 LAB — FOLLICLE STIMULATING HORMONE: FSH: 7.1 m[IU]/mL

## 2016-08-15 LAB — PROGESTERONE: Progesterone: <0.5 ng/mL

## 2016-08-15 LAB — TESTOSTERONE: Testosterone: 34 ng/dL

## 2016-08-18 DIAGNOSIS — F340 Cyclothymic disorder: Secondary | ICD-10-CM | POA: Diagnosis not present

## 2016-08-18 LAB — ESTROGENS, TOTAL: Estrogen: 346.1 pg/mL

## 2016-09-06 DIAGNOSIS — F340 Cyclothymic disorder: Secondary | ICD-10-CM | POA: Diagnosis not present

## 2016-09-08 DIAGNOSIS — F4310 Post-traumatic stress disorder, unspecified: Secondary | ICD-10-CM | POA: Diagnosis not present

## 2016-09-13 ENCOUNTER — Ambulatory Visit: Payer: Self-pay | Admitting: Physician Assistant

## 2016-09-14 DIAGNOSIS — J209 Acute bronchitis, unspecified: Secondary | ICD-10-CM | POA: Diagnosis not present

## 2016-09-14 DIAGNOSIS — J019 Acute sinusitis, unspecified: Secondary | ICD-10-CM | POA: Diagnosis not present

## 2016-09-21 DIAGNOSIS — F340 Cyclothymic disorder: Secondary | ICD-10-CM | POA: Diagnosis not present

## 2016-09-22 DIAGNOSIS — F4310 Post-traumatic stress disorder, unspecified: Secondary | ICD-10-CM | POA: Diagnosis not present

## 2016-09-28 DIAGNOSIS — F340 Cyclothymic disorder: Secondary | ICD-10-CM | POA: Diagnosis not present

## 2016-10-05 DIAGNOSIS — F340 Cyclothymic disorder: Secondary | ICD-10-CM | POA: Diagnosis not present

## 2016-10-24 ENCOUNTER — Ambulatory Visit: Payer: Self-pay | Admitting: Physician Assistant

## 2016-11-01 DIAGNOSIS — F340 Cyclothymic disorder: Secondary | ICD-10-CM | POA: Diagnosis not present

## 2016-11-29 ENCOUNTER — Telehealth: Payer: Self-pay | Admitting: Physician Assistant

## 2016-11-29 NOTE — Telephone Encounter (Signed)
Pt declined flu shot °

## 2016-12-04 DIAGNOSIS — D225 Melanocytic nevi of trunk: Secondary | ICD-10-CM | POA: Diagnosis not present

## 2016-12-04 DIAGNOSIS — D485 Neoplasm of uncertain behavior of skin: Secondary | ICD-10-CM | POA: Diagnosis not present

## 2016-12-21 DIAGNOSIS — F340 Cyclothymic disorder: Secondary | ICD-10-CM | POA: Diagnosis not present

## 2016-12-28 DIAGNOSIS — H6123 Impacted cerumen, bilateral: Secondary | ICD-10-CM | POA: Diagnosis not present

## 2017-01-03 DIAGNOSIS — Z1231 Encounter for screening mammogram for malignant neoplasm of breast: Secondary | ICD-10-CM | POA: Diagnosis not present

## 2017-01-03 DIAGNOSIS — Z6823 Body mass index (BMI) 23.0-23.9, adult: Secondary | ICD-10-CM | POA: Diagnosis not present

## 2017-01-04 LAB — HM MAMMOGRAPHY

## 2017-01-08 ENCOUNTER — Ambulatory Visit (INDEPENDENT_AMBULATORY_CARE_PROVIDER_SITE_OTHER): Payer: BLUE CROSS/BLUE SHIELD | Admitting: Physician Assistant

## 2017-01-08 ENCOUNTER — Encounter: Payer: Self-pay | Admitting: Physician Assistant

## 2017-01-08 VITALS — BP 117/71 | HR 82 | Ht 64.0 in | Wt 145.0 lb

## 2017-01-08 DIAGNOSIS — J069 Acute upper respiratory infection, unspecified: Secondary | ICD-10-CM | POA: Diagnosis not present

## 2017-01-08 MED ORDER — IPRATROPIUM BROMIDE 0.06 % NA SOLN: 2.0000 | Freq: Four times a day (QID) | NASAL | 1 refills | Status: DC

## 2017-01-08 MED ORDER — PREDNISONE 50 MG PO TABS: ORAL_TABLET | ORAL | 0 refills | Status: DC

## 2017-01-08 NOTE — Patient Instructions (Signed)
Upper Respiratory Infection, Adult Most upper respiratory infections (URIs) are caused by a virus. A URI affects the nose, throat, and upper air passages. The most common type of URI is often called "the common cold." Follow these instructions at home:  Take medicines only as told by your doctor.  Gargle warm saltwater or take cough drops to comfort your throat as told by your doctor.  Use a warm mist humidifier or inhale steam from a shower to increase air moisture. This may make it easier to breathe.  Drink enough fluid to keep your pee (urine) clear or pale yellow.  Eat soups and other clear broths.  Have a healthy diet.  Rest as needed.  Go back to work when your fever is gone or your doctor says it is okay.  You may need to stay home longer to avoid giving your URI to others.  You can also wear a face mask and wash your hands often to prevent spread of the virus.  Use your inhaler more if you have asthma.  Do not use any tobacco products, including cigarettes, chewing tobacco, or electronic cigarettes. If you need help quitting, ask your doctor. Contact a doctor if:  You are getting worse, not better.  Your symptoms are not helped by medicine.  You have chills.  You are getting more short of breath.  You have brown or red mucus.  You have yellow or brown discharge from your nose.  You have pain in your face, especially when you bend forward.  You have a fever.  You have puffy (swollen) neck glands.  You have pain while swallowing.  You have white areas in the back of your throat. Get help right away if:  You have very bad or constant:  Headache.  Ear pain.  Pain in your forehead, behind your eyes, and over your cheekbones (sinus pain).  Chest pain.  You have long-lasting (chronic) lung disease and any of the following:  Wheezing.  Long-lasting cough.  Coughing up blood.  A change in your usual mucus.  You have a stiff neck.  You have  changes in your:  Vision.  Hearing.  Thinking.  Mood. This information is not intended to replace advice given to you by your health care provider. Make sure you discuss any questions you have with your health care provider. Document Released: 03/13/2008 Document Revised: 05/28/2016 Document Reviewed: 12/31/2013 Elsevier Interactive Patient Education  2017 [employer] Inc[employer]  

## 2017-01-08 NOTE — Progress Notes (Signed)
   Subjective:    Patient ID: Andrea Mcguire, female    DOB: 12-Jun-1976, 41 y.o.   MRN: [id number]  HPI  Pt is a 41 yo female who presents to the clinic with 6 days of sinus pressure, cough, chest tightness. She feels like she is getting worse. She is using albuterol inhaler, mucinex, zyrtec, and delsym with some relief. No one else is sick. Sputum is yellow to clear. No fever, chills, body aches, ST, ear pain.   Depression and Anxiety is managed on Zoloft/lamictal and she is feeling much better. She sees Dr. Toy Cookey psychiatrist.   Review of Systems See HPI>     Objective:   Physical Exam  Constitutional: She is oriented to person, place, and time. She appears well-developed and well-nourished.  HENT:  Head: Normocephalic and atraumatic.  Right Ear: External ear normal.  Left Ear: External ear normal.  Dull TM with erythema.  Tenderness over maxillary sinses.  Oropharynx erythematous without tonsilar swelling or exudate.  Bilateral nasal turbinates red and swollen.   Eyes: Conjunctivae are normal. Right eye exhibits no discharge. Left eye exhibits no discharge.  Neck: Normal range of motion. Neck supple.  Cardiovascular: Normal rate, regular rhythm and normal heart sounds.   Pulmonary/Chest: Effort normal and breath sounds normal. She has no wheezes. She has no rales. She exhibits no tenderness.  Lymphadenopathy:    She has no cervical adenopathy.  Neurological: She is alert and oriented to person, place, and time.  Psychiatric: She has a normal mood and affect. Her behavior is normal.          Assessment & Plan:  Marland KitchenMarland KitchenMisty was seen today for cough and headache.  Diagnoses and all orders for this visit:  Acute upper respiratory infection -     ipratropium (ATROVENT) 0.06 % nasal spray; Place 2 sprays into both nostrils 4 (four) times daily. -     predniSONE (DELTASONE) 50 MG tablet; Take one tablet daily for 5 days.   Reassured patient lung exam sounded great. Prednisone  and atrovent sent to pharmacy. Could be developing sinusitis;However most sinusitis clears on it's own. We hold out for another 2-3 days and see if symptoms are improving. Continue with mucinex and delsym.

## 2017-01-09 ENCOUNTER — Encounter: Payer: Self-pay | Admitting: Physician Assistant

## 2017-01-11 DIAGNOSIS — Z6824 Body mass index (BMI) 24.0-24.9, adult: Secondary | ICD-10-CM | POA: Diagnosis not present

## 2017-01-11 DIAGNOSIS — Z01419 Encounter for gynecological examination (general) (routine) without abnormal findings: Secondary | ICD-10-CM | POA: Diagnosis not present

## 2017-04-20 DIAGNOSIS — F340 Cyclothymic disorder: Secondary | ICD-10-CM | POA: Diagnosis not present

## 2017-08-14 DIAGNOSIS — Z01419 Encounter for gynecological examination (general) (routine) without abnormal findings: Secondary | ICD-10-CM | POA: Diagnosis not present

## 2017-08-14 DIAGNOSIS — Z6825 Body mass index (BMI) 25.0-25.9, adult: Secondary | ICD-10-CM | POA: Diagnosis not present

## 2017-08-16 ENCOUNTER — Ambulatory Visit (INDEPENDENT_AMBULATORY_CARE_PROVIDER_SITE_OTHER): Payer: BLUE CROSS/BLUE SHIELD

## 2017-08-16 ENCOUNTER — Ambulatory Visit: Payer: BLUE CROSS/BLUE SHIELD

## 2017-08-16 ENCOUNTER — Encounter: Payer: Self-pay | Admitting: Physician Assistant

## 2017-08-16 ENCOUNTER — Ambulatory Visit (INDEPENDENT_AMBULATORY_CARE_PROVIDER_SITE_OTHER): Payer: BLUE CROSS/BLUE SHIELD | Admitting: Physician Assistant

## 2017-08-16 VITALS — BP 117/69 | HR 68 | Ht 64.0 in | Wt 150.0 lb

## 2017-08-16 DIAGNOSIS — K5901 Slow transit constipation: Secondary | ICD-10-CM

## 2017-08-16 DIAGNOSIS — R232 Flushing: Secondary | ICD-10-CM | POA: Diagnosis not present

## 2017-08-16 DIAGNOSIS — Z131 Encounter for screening for diabetes mellitus: Secondary | ICD-10-CM

## 2017-08-16 DIAGNOSIS — R61 Generalized hyperhidrosis: Secondary | ICD-10-CM

## 2017-08-16 DIAGNOSIS — Z13220 Encounter for screening for lipoid disorders: Secondary | ICD-10-CM | POA: Diagnosis not present

## 2017-08-16 DIAGNOSIS — R1032 Left lower quadrant pain: Secondary | ICD-10-CM

## 2017-08-16 MED ORDER — POLYETHYLENE GLYCOL 3350 17 G PO PACK: 17.0000 g | PACK | Freq: Two times a day (BID) | ORAL | 2 refills | Status: DC

## 2017-08-16 NOTE — Progress Notes (Signed)
Discussed with patient in clinic.

## 2017-08-16 NOTE — Progress Notes (Signed)
Subjective:    Patient ID: Andrea Mcguire, female    DOB: Nov 11, 1975, 41 y.o.   MRN: [id number]  HPI Patient is a 41 year old female who presents to the clinic with left lower quadrant pain persistent for the last 2 weeks.  She started experiencing this pain a few months ago.  She went to her GYN for annual exam.  Per patient she was told that her left ovary was swollen.  She wanted to do an ultrasound but could not get it done for a few weeks.  Her GYN told her to call her PCP.  She denies any fever, chills.  She denies any dysuria or flank pain.  She does not really have a great appetite but does not feel nauseated when she eats.  She does admit that she is constipated.  Her last full bowel movement was about a week and a half ago.  Last night she did pass a palpable hard stool.  She has not done anything to improve her constipation.  Her maternal grandmother did have ovarian cancer but did not pass away from this.  Patient is also having some hot flashes and night sweats.  Her GYN wanted her hormones checked.  .. Active Ambulatory Problems    Diagnosis Date Noted  . Other pulmonary embolism and infarction 10/29/2008  . ACUTE MAXILLARY SINUSITIS 12/21/2008  . OTHER ACUTE SINUSITIS 09/24/2008  . ALLERGIC RHINITIS CAUSE UNSPECIFIED 07/27/2009  . PNEUMONIA, ORGANISM UNSPECIFIED 09/16/2008  . PLEURISY 11/04/2008  . FATIGUE 12/08/2008  . PARESTHESIA 12/08/2008  . DYSPNEA 09/16/2008  . VIRAL URI 11/02/2010  . DEPRESSION, MILD 12/19/2010  . Weight gain due to medication 05/20/2014  . History of pulmonary embolus (PE) 11/09/2014  . Anxiety state 11/09/2014  . MDD (major depressive disorder), recurrent episode, moderate ([place]) 07/26/2015  . Malaise and fatigue 07/26/2015  . PTSD (post-traumatic stress disorder) 07/26/2015  . Shingles 07/27/2015  . Insomnia 09/10/2015  . GAD (generalized anxiety disorder) 11/15/2015  . Mood changes 08/15/2016  . Elevated LDL cholesterol level 08/15/2016   . Hot flashes 08/17/2017  . Night sweats 08/17/2017  . Left lower quadrant pain 08/17/2017   Resolved Ambulatory Problems    Diagnosis Date Noted  . Anxiety state, unspecified 06/04/2013   No Additional Past Medical History            Review of Systems  All other systems reviewed and are negative.      Objective:   Physical Exam  Constitutional: She is oriented to person, place, and time. She appears well-developed and well-nourished.  HENT:  Head: Normocephalic and atraumatic.  Cardiovascular: Normal rate, regular rhythm and normal heart sounds.  Pulmonary/Chest: Effort normal and breath sounds normal.  Abdominal: Soft. Bowel sounds are normal. She exhibits no mass.  Slightly bloated.  Tenderness midly over LLQ. No guarding or rebound.   Neurological: She is alert and oriented to person, place, and time.  Psychiatric: She has a normal mood and affect. Her behavior is normal.          Assessment & Plan:  Marland KitchenMarland KitchenDiagnoses and all orders for this visit:  Left lower quadrant pain -     CA 125 -     Cancel: US PELVIS (TRANSABDOMINAL ONLY) -     Cancel: US PELVIS TRANSVANGINAL NON-OB (TV ONLY) -     US PELVIC COMPLETE WITH TRANSVAGINAL  Screening for lipid disorders -     Lipid Panel w/reflex Direct LDL  Hot flashes -  CA 125 -     CBC with Differential/Platelet -     TSH -     Testosterone -     Estrogens, total -     LH -     Testosterone, Total, LC/MS/MS  Night sweats -     CA 125 -     CBC with Differential/Platelet -     TSH -     Testosterone -     Estrogens, total -     LH -     Testosterone, Total, LC/MS/MS  Screening for diabetes mellitus -     COMPLETE METABOLIC PANEL WITH GFR  Slow transit constipation -     polyethylene glycol (MIRALAX / GLYCOLAX) packet; Take 17 g 2 (two) times daily by mouth. Until stooling regularly   Reviewed ultrasound done stat.  Normal ultrasound.  No reason for left lower quadrant pain found on ultrasound.   Reassured patient.  Certainly feel like this could be that pain is due to constipation.  I would like to aggressively treat constipation and see how patient is doing in the next week.  She is to start MiraLAX twice a day until stooling regularly.  If that did not not work I did give her a copy of digestive health bowel cleanout that she could start.  Labs ordered today.

## 2017-08-17 DIAGNOSIS — R232 Flushing: Secondary | ICD-10-CM | POA: Insufficient documentation

## 2017-08-17 DIAGNOSIS — R61 Generalized hyperhidrosis: Secondary | ICD-10-CM | POA: Insufficient documentation

## 2017-08-17 DIAGNOSIS — R1032 Left lower quadrant pain: Secondary | ICD-10-CM | POA: Insufficient documentation

## 2017-08-17 NOTE — Progress Notes (Signed)
Call pt: cholesterol looks great.  You are not anemic.  Thyroid looks great.  Kidney/glucose/liver look great.   Hormones still pending.

## 2017-08-20 LAB — CBC WITH DIFFERENTIAL/PLATELET
Basophils Absolute: 42 cells/uL (ref 0–200)
Basophils Relative: 0.8 %
Eosinophils Absolute: 138 cells/uL (ref 15–500)
Eosinophils Relative: 2.6 %
HCT: 38.3 % (ref 35.0–45.0)
Hemoglobin: 13.5 g/dL (ref 11.7–15.5)
Lymphs Abs: 1972 cells/uL (ref 850–3900)
MCH: 31.8 pg (ref 27.0–33.0)
MCHC: 35.2 g/dL (ref 32.0–36.0)
MCV: 90.1 fL (ref 80.0–100.0)
MPV: 10.2 fL (ref 7.5–12.5)
Monocytes Relative: 6.8 %
Neutro Abs: 2788 cells/uL (ref 1500–7800)
Neutrophils Relative %: 52.6 %
Platelets: 247 10*3/uL (ref 140–400)
RBC: 4.25 10*6/uL (ref 3.80–5.10)
RDW: 12.1 % (ref 11.0–15.0)
Total Lymphocyte: 37.2 %
WBC mixed population: 360 cells/uL (ref 200–950)
WBC: 5.3 10*3/uL (ref 3.8–10.8)

## 2017-08-20 LAB — TESTOSTERONE, TOTAL, LC/MS/MS: Testosterone, Total, LC-MS-MS: 11 ng/dL (ref 2–45)

## 2017-08-20 LAB — LIPID PANEL W/REFLEX DIRECT LDL
Cholesterol: 186 mg/dL (ref <200)
HDL: 68 mg/dL (ref >50)
LDL Cholesterol (Calc): 101 mg/dL (calc) — ABNORMAL HIGH
Non-HDL Cholesterol (Calc): 118 mg/dL (calc) (ref <130)
Total CHOL/HDL Ratio: 2.7 (calc) (ref <5.0)
Triglycerides: 84 mg/dL (ref <150)

## 2017-08-20 LAB — COMPLETE METABOLIC PANEL WITH GFR
AG Ratio: 2 (calc) (ref 1.0–2.5)
ALT: 14 U/L (ref 6–29)
AST: 15 U/L (ref 10–30)
Albumin: 4.4 g/dL (ref 3.6–5.1)
Alkaline phosphatase (APISO): 28 U/L — ABNORMAL LOW (ref 33–115)
BUN: 11 mg/dL (ref 7–25)
CO2: 29 mmol/L (ref 20–32)
Calcium: 9.2 mg/dL (ref 8.6–10.2)
Chloride: 103 mmol/L (ref 98–110)
Creat: 0.68 mg/dL (ref 0.50–1.10)
GFR, Est African American: 127 mL/min/{1.73_m2} (ref >=60)
GFR, Est Non African American: 109 mL/min/{1.73_m2} (ref >=60)
Globulin: 2.2 g/dL (calc) (ref 1.9–3.7)
Glucose, Bld: 87 mg/dL (ref 65–99)
Potassium: 4 mmol/L (ref 3.5–5.3)
Sodium: 138 mmol/L (ref 135–146)
Total Bilirubin: 0.9 mg/dL (ref 0.2–1.2)
Total Protein: 6.6 g/dL (ref 6.1–8.1)

## 2017-08-20 LAB — ESTROGENS, TOTAL: Estrogen: 219.7 pg/mL

## 2017-08-20 LAB — LUTEINIZING HORMONE: LH: 3.3 m[IU]/mL

## 2017-08-20 LAB — CA 125: CA 125: 44 U/mL — ABNORMAL HIGH (ref <35)

## 2017-08-20 LAB — TSH: TSH: 1.06 mIU/L

## 2017-08-20 NOTE — Progress Notes (Signed)
Call pt: hormone seem appropriate. You CA125 is elevated this is a marker use to detect likelyhood of ovarian/endometrium cancer. I am going to send to GYN for further interpretation. This could also be a false negative.   Please send to physician for womens.

## 2017-08-24 DIAGNOSIS — R140 Abdominal distension (gaseous): Secondary | ICD-10-CM | POA: Diagnosis not present

## 2017-08-24 DIAGNOSIS — Z6824 Body mass index (BMI) 24.0-24.9, adult: Secondary | ICD-10-CM | POA: Diagnosis not present

## 2017-09-12 DIAGNOSIS — F340 Cyclothymic disorder: Secondary | ICD-10-CM | POA: Diagnosis not present

## 2017-10-19 ENCOUNTER — Encounter: Payer: Self-pay | Admitting: Physician Assistant

## 2017-12-14 DIAGNOSIS — J019 Acute sinusitis, unspecified: Secondary | ICD-10-CM | POA: Diagnosis not present

## 2017-12-14 DIAGNOSIS — J069 Acute upper respiratory infection, unspecified: Secondary | ICD-10-CM | POA: Diagnosis not present

## 2018-06-12 DIAGNOSIS — N941 Unspecified dyspareunia: Secondary | ICD-10-CM | POA: Diagnosis not present

## 2018-06-12 DIAGNOSIS — Z8041 Family history of malignant neoplasm of ovary: Secondary | ICD-10-CM | POA: Diagnosis not present

## 2018-06-12 DIAGNOSIS — R102 Pelvic and perineal pain: Secondary | ICD-10-CM | POA: Diagnosis not present

## 2018-06-12 DIAGNOSIS — R971 Elevated cancer antigen 125 [CA 125]: Secondary | ICD-10-CM | POA: Diagnosis not present

## 2018-06-17 ENCOUNTER — Ambulatory Visit: Payer: BLUE CROSS/BLUE SHIELD | Admitting: Osteopathic Medicine

## 2018-06-17 ENCOUNTER — Encounter: Payer: Self-pay | Admitting: Osteopathic Medicine

## 2018-06-17 VITALS — BP 125/83 | HR 62 | Temp 98.2°F | Wt 149.6 lb

## 2018-06-17 DIAGNOSIS — Z23 Encounter for immunization: Secondary | ICD-10-CM

## 2018-06-17 DIAGNOSIS — R1031 Right lower quadrant pain: Secondary | ICD-10-CM

## 2018-06-17 LAB — LIPASE: Lipase: 13 U/L (ref 7–60)

## 2018-06-17 NOTE — Progress Notes (Signed)
HPI: Andrea Mcguire is a 42 y.o. female who  has no past medical history on file.  she presents to [facility] today, 06/17/18,  for chief complaint of:  Abdominal pain  Pain has been ongoing for a couple weeks.  This point, she is having abd pain bad enough to wake her up. Associated with nausea.  Located in the right lower abdomen, previously was in left lower abdomen.  No unusual vaginal bleeding or discharge, no dysuria/hematuria, no diarrhea, constipation, blood in stool.  LMP was about 2 weeks ago  Recently seen by OBGYN couple of days ago, reports negative pregnancy test there.  Ultrasound was performed and has upcoming biopsy (sounds like possible endometrial biopsy, I do not have ultrasound results but she had some concerns about polyp versus cancer), has follow up 07/10/18 for OBGYN. She called them this morning to try to get in today but they advised she come  In to see [place] in Primary Care.       Past medical history, surgical history, and family history reviewed.  Current medication list and allergy/intolerance information reviewed.   (See remainder of HPI, ROS, Phys Exam below)  No results found.  Results for orders placed or performed in visit on 06/17/18 (from the past 72 hour(s))  CBC with Differential/Platelet     Status: None   Collection Time: 06/17/18  4:25 PM  Result Value Ref Range   WBC 7.3 3.8 - 10.8 Thousand/uL   RBC 4.31 3.80 - 5.10 Million/uL   Hemoglobin 13.5 11.7 - 15.5 g/dL   HCT 38.9 35.0 - 45.0 %   MCV 90.3 80.0 - 100.0 fL   MCH 31.3 27.0 - 33.0 pg   MCHC 34.7 32.0 - 36.0 g/dL   RDW 12.4 11.0 - 15.0 %   Platelets 251 140 - 400 Thousand/uL   MPV 10.1 7.5 - 12.5 fL   Neutro Abs 4,380 1,500 - 7,800 cells/uL   Lymphs Abs 2,263 850 - 3,900 cells/uL   WBC mixed population 423 200 - 950 cells/uL   Eosinophils Absolute 161 15 - 500 cells/uL   Basophils Absolute 73 0 - 200 cells/uL   Neutrophils Relative % 60 %   Total  Lymphocyte 31.0 %   Monocytes Relative 5.8 %   Eosinophils Relative 2.2 %   Basophils Relative 1.0 %  COMPLETE METABOLIC PANEL WITH GFR     Status: Abnormal   Collection Time: 06/17/18  4:25 PM  Result Value Ref Range   Glucose, Bld 91 65 - 99 mg/dL    Comment: .            Fasting reference interval .    BUN 10 7 - 25 mg/dL   Creat 0.83 0.50 - 1.10 mg/dL   GFR, Est Non African American 88 > OR = 60 mL/min/1.68m2   GFR, Est African American 102 > OR = 60 mL/min/1.62m2   BUN/Creatinine Ratio NOT APPLICABLE 6 - 22 (calc)   Sodium 138 135 - 146 mmol/L   Potassium 3.9 3.5 - 5.3 mmol/L   Chloride 105 98 - 110 mmol/L   CO2 25 20 - 32 mmol/L   Calcium 9.4 8.6 - 10.2 mg/dL   Total Protein 6.9 6.1 - 8.1 g/dL   Albumin 4.7 3.6 - 5.1 g/dL   Globulin 2.2 1.9 - 3.7 g/dL (calc)   AG Ratio 2.1 1.0 - 2.5 (calc)   Total Bilirubin 0.5 0.2 - 1.2 mg/dL   Alkaline phosphatase (APISO)  31 (L) 33 - 115 U/L   AST 11 10 - 30 U/L   ALT 10 6 - 29 U/L  TSH     Status: None   Collection Time: 06/17/18  4:25 PM  Result Value Ref Range   TSH 1.82 mIU/L    Comment:           Reference Range .           > or = 20 Years  0.40-4.50 .                Pregnancy Ranges           First trimester    0.26-2.66           Second trimester   0.55-2.73           Third trimester    0.43-2.91   Lipase     Status: None   Collection Time: 06/17/18  4:30 PM  Result Value Ref Range   Lipase 13 7 - 60 U/L     ASSESSMENT/PLAN: The primary encounter diagnosis was Abdominal pain, RLQ. A diagnosis of Need for Tdap vaccination was also pertinent to this visit.   Would like to have results from recent ultrasound with OB/GYN, she says that there was no mention of anything like an ovarian cyst but this is certainly in the differential.  Would like to confirm negative pregnancy, patient declines repeat test today.  Without abnormal bleeding, ectopic seems unlikely but again, I do not have results.  Where she is that in her  menstrual cycle, questionable mittelschmerz?  Seems less likely GI but could certainly get a CT scan to rule out appendicitis or other GI pathology.  No associated signs or symptoms of colitis, gastroenteritis.  Orders Placed This Encounter  Procedures  . CT Abdomen Pelvis W Contrast  . Tdap vaccine greater than or equal to 7yo IM  . CBC with Differential/Platelet  . COMPLETE METABOLIC PANEL WITH GFR  . TSH  . Lipase     Follow-up plan: Return for recheck depending on results - or follow up with OBGYN .             ############################################ ############################################ ############################################ ############################################    Outpatient Encounter Medications as of 06/17/2018  Medication Sig Note  . Albuterol Sulfate (PROAIR RESPICLICK) 440 (90 BASE) MCG/ACT AEPB Inhale 2 puffs into the lungs every 4 (four) hours as needed. 06/17/2018: As per pt, taking PRN  . clonazePAM (KLONOPIN) 1 MG tablet  08/14/2016: Received from: External Pharmacy  . lamoTRIgine (LAMICTAL) 150 MG tablet Take 150 mg by mouth daily.   . sertraline (ZOLOFT) 25 MG tablet Take 10 mg by mouth daily.  08/14/2016: Received from: External Pharmacy  . polyethylene glycol (MIRALAX / GLYCOLAX) packet Take 17 g 2 (two) times daily by mouth. Until stooling regularly (Patient not taking: Reported on 06/17/2018)    No facility-administered encounter medications on file as of 06/17/2018.    Allergies  Allergen Reactions  . Penicillins Hives  . Trazodone And Nefazodone     Worst dreams.  Rennis Harding [Vortioxetine]     Rash, itching,increased anxiety  . Wellbutrin [Bupropion]     More anxiety and felt crazy.   Marland Kitchen Zoloft [Sertraline Hcl]     Decreased libido and ability to climax.      Review of Systems:  Constitutional: No recent illness  HEENT: No  headache, no vision change  Cardiac: No  chest pain, No  pressure, No  palpitations  Respiratory:  No  shortness of breath. No  Cough  Gastrointestinal: +abdominal pain, no change on bowel habits  Musculoskeletal: No new myalgia/arthralgia  Skin: No  Rash  Neurologic: No  weakness, No  Dizziness  Psychiatric: No  concerns with depression, +concerns with anxiety  Exam:  BP 125/83 (BP Location: Left Arm, Patient Position: Sitting, Cuff Size: Normal)   Pulse 62   Temp 98.2 F (36.8 C) (Oral)   Wt 149 lb 9.6 oz (67.9 kg)   BMI 25.68 kg/m   Constitutional: VS see above. General Appearance: alert, well-developed, well-nourished, NAD  Eyes: Normal lids and conjunctive, non-icteric sclera  Ears, Nose, Mouth, Throat: MMM, Normal external inspection ears/nares/mouth/lips/gums.  Neck: No masses, trachea midline.   Respiratory: Normal respiratory effort. no wheeze, no rhonchi, no rales  Cardiovascular: S1/S2 normal, no murmur, no rub/gallop auscultated. RRR.   Musculoskeletal: Gait normal. Symmetric and independent movement of all extremities  Abdominal: Some tenderness to palpation right lower quadrant, no rebound/guarding.  Tympanic to percussion a little in that area.  Some epigastric tenderness.  No left lower quadrant tenderness.  Bowel sounds within normal limits x4.  No distention.  Rectal exam deferred.  Neurological: Normal balance/coordination. No tremor.  Skin: warm, dry, intact.   Psychiatric: Normal judgment/insight. Normal mood and affect. Oriented x3.   Visit summary with medication list and pertinent instructions was printed for patient to review, advised to alert [place] if any changes needed. All questions at time of visit were answered - patient instructed to contact office with any additional concerns. ER/RTC precautions were reviewed with the patient and understanding verbalized.   Follow-up plan: Return for recheck depending on results - or follow up with OBGYN .   Please note: voice recognition software was used to produce this  document, and typos may escape review. Please contact Dr. Sheppard Coil for any needed clarifications.

## 2018-06-18 ENCOUNTER — Telehealth: Payer: Self-pay

## 2018-06-18 ENCOUNTER — Ambulatory Visit (INDEPENDENT_AMBULATORY_CARE_PROVIDER_SITE_OTHER): Payer: BLUE CROSS/BLUE SHIELD

## 2018-06-18 ENCOUNTER — Encounter: Payer: Self-pay | Admitting: Osteopathic Medicine

## 2018-06-18 DIAGNOSIS — R197 Diarrhea, unspecified: Secondary | ICD-10-CM | POA: Diagnosis not present

## 2018-06-18 DIAGNOSIS — D1803 Hemangioma of intra-abdominal structures: Secondary | ICD-10-CM | POA: Diagnosis not present

## 2018-06-18 DIAGNOSIS — R1110 Vomiting, unspecified: Secondary | ICD-10-CM | POA: Diagnosis not present

## 2018-06-18 LAB — COMPLETE METABOLIC PANEL WITH GFR
AG Ratio: 2.1 (calc) (ref 1.0–2.5)
ALT: 10 U/L (ref 6–29)
AST: 11 U/L (ref 10–30)
Albumin: 4.7 g/dL (ref 3.6–5.1)
Alkaline phosphatase (APISO): 31 U/L — ABNORMAL LOW (ref 33–115)
BUN: 10 mg/dL (ref 7–25)
CO2: 25 mmol/L (ref 20–32)
Calcium: 9.4 mg/dL (ref 8.6–10.2)
Chloride: 105 mmol/L (ref 98–110)
Creat: 0.83 mg/dL (ref 0.50–1.10)
GFR, Est African American: 102 mL/min/{1.73_m2} (ref >=60)
GFR, Est Non African American: 88 mL/min/{1.73_m2} (ref >=60)
Globulin: 2.2 g/dL (calc) (ref 1.9–3.7)
Glucose, Bld: 91 mg/dL (ref 65–99)
Potassium: 3.9 mmol/L (ref 3.5–5.3)
Sodium: 138 mmol/L (ref 135–146)
Total Bilirubin: 0.5 mg/dL (ref 0.2–1.2)
Total Protein: 6.9 g/dL (ref 6.1–8.1)

## 2018-06-18 LAB — CBC WITH DIFFERENTIAL/PLATELET
Basophils Absolute: 73 cells/uL (ref 0–200)
Basophils Relative: 1 %
Eosinophils Absolute: 161 cells/uL (ref 15–500)
Eosinophils Relative: 2.2 %
HCT: 38.9 % (ref 35.0–45.0)
Hemoglobin: 13.5 g/dL (ref 11.7–15.5)
Lymphs Abs: 2263 cells/uL (ref 850–3900)
MCH: 31.3 pg (ref 27.0–33.0)
MCHC: 34.7 g/dL (ref 32.0–36.0)
MCV: 90.3 fL (ref 80.0–100.0)
MPV: 10.1 fL (ref 7.5–12.5)
Monocytes Relative: 5.8 %
Neutro Abs: 4380 cells/uL (ref 1500–7800)
Neutrophils Relative %: 60 %
Platelets: 251 10*3/uL (ref 140–400)
RBC: 4.31 10*6/uL (ref 3.80–5.10)
RDW: 12.4 % (ref 11.0–15.0)
Total Lymphocyte: 31 %
WBC mixed population: 423 cells/uL (ref 200–950)
WBC: 7.3 10*3/uL (ref 3.8–10.8)

## 2018-06-18 LAB — TSH: TSH: 1.82 mIU/L

## 2018-06-18 MED ORDER — IOPAMIDOL (ISOVUE-300) INJECTION 61%
100.0000 mL | Freq: Once | INTRAVENOUS | Status: AC | PRN
  Administered 2018-06-18: 100 mL via INTRAVENOUS

## 2018-06-18 NOTE — Telephone Encounter (Signed)
Pt states she was to have a CT scan completed yesterday, but was told she had to get PA due to her insurance. Pt wants to schedule an appt no later than Wednesday. Pls advise, thanks.

## 2018-06-18 NOTE — Telephone Encounter (Signed)
Thanks for the update

## 2018-06-18 NOTE — Telephone Encounter (Signed)
This is completed and imaging has already contacted her.

## 2018-07-02 DIAGNOSIS — F340 Cyclothymic disorder: Secondary | ICD-10-CM | POA: Diagnosis not present

## 2018-07-08 DIAGNOSIS — L700 Acne vulgaris: Secondary | ICD-10-CM | POA: Diagnosis not present

## 2018-07-10 DIAGNOSIS — N946 Dysmenorrhea, unspecified: Secondary | ICD-10-CM | POA: Diagnosis not present

## 2018-07-10 DIAGNOSIS — N941 Unspecified dyspareunia: Secondary | ICD-10-CM | POA: Diagnosis not present

## 2018-07-10 DIAGNOSIS — N924 Excessive bleeding in the premenopausal period: Secondary | ICD-10-CM | POA: Diagnosis not present

## 2018-07-22 DIAGNOSIS — Z01818 Encounter for other preprocedural examination: Secondary | ICD-10-CM | POA: Diagnosis not present

## 2018-07-22 DIAGNOSIS — N941 Unspecified dyspareunia: Secondary | ICD-10-CM | POA: Diagnosis not present

## 2018-07-22 DIAGNOSIS — N939 Abnormal uterine and vaginal bleeding, unspecified: Secondary | ICD-10-CM | POA: Diagnosis not present

## 2018-07-24 ENCOUNTER — Encounter (HOSPITAL_BASED_OUTPATIENT_CLINIC_OR_DEPARTMENT_OTHER): Payer: Self-pay | Admitting: *Deleted

## 2018-07-25 ENCOUNTER — Encounter: Payer: Self-pay | Admitting: Physician Assistant

## 2018-07-27 ENCOUNTER — Encounter (HOSPITAL_BASED_OUTPATIENT_CLINIC_OR_DEPARTMENT_OTHER): Payer: Self-pay | Admitting: Obstetrics and Gynecology

## 2018-07-27 NOTE — H&P (Addendum)
Andrea Mcguire is an 42 y.o. female. Presenting for scheduled surgery. She has a h/o AUB with known endometrial polyps. Also has a h/o a mildly elevated CA125 (44 --> 50). Lastly, has pressure w/intercourse.   Pertinent Gynecological History: Menses: flow is excessive with use of 12 pads or tampons on heaviest days Bleeding: dysfunctional uterine bleeding Contraception: vasectomy DES exposure: denies Blood transfusions: none Sexually transmitted diseases: no past history Previous GYN Procedures: none  Last mammogram: normal Date: 2018 Last pap: normal Date: 2018 OB History: G3, P2012   Menstrual History: No LMP recorded.    Past Medical History:  Diagnosis Date  . Anxiety   . Asthma   . Herpes simplex virus (HSV) infection   . Pulmonary embolism ([place])    over year ago, in setting of immobility. Testing reported to be neg by pt  . Vaginal Pap smear, abnormal     Past Surgical History:  Procedure Laterality Date  . COSMETIC SURGERY    . nsvd     x 2  . tummy tuck      Family History  Problem Relation Age of Onset  . Depression Mother   . Anxiety disorder Mother   . Depression Sister   . Anxiety disorder Sister     Social History:  reports that she has never smoked. She has never used smokeless tobacco. She reports that she drinks about 4.0 standard drinks of alcohol per week. She reports that she does not use drugs.  Allergies:  Allergies  Allergen Reactions  . Penicillins Hives    Has patient had a PCN reaction causing immediate rash, facial/tongue/throat swelling, SOB or lightheadedness with hypotension: unkn Has patient had a PCN reaction causing severe rash involving mucus membranes or skin necrosis: unkn Has patient had a PCN reaction that required hospitalization: unkn Has patient had a PCN reaction occurring within the last 10 years: no If all of the above answers are "NO", then may proceed with Cephalosporin use.   . Trazodone And Nefazodone     Worst  dreams.  Rennis Harding [Vortioxetine]     Rash, itching,increased anxiety  . Wellbutrin [Bupropion]     More anxiety and felt crazy.   Marland Kitchen Zoloft [Sertraline Hcl]     Decreased libido and ability to climax.    No medications prior to admission.    ROS  There were no vitals taken for this visit. Physical Exam Gen: well appearing, NAD CV: Reg rate Pulm: NWOB Abd: soft, nondistended, nontender, no massse GYN: uterus 10 week size, no adnexa ttp/CMT. No prolapse.  Ext: No edema b/l  No results found for this or any previous visit (from the past 24 hour(s)).  No results found. TVUS/SIS: 9cm uterus w/polyps. ovaries WNL  Assessment/Plan: 54GD [id number] presenting for scheduled surgery for her h/o AUB, pelvic pressure, and elevated CA-125. No ovaries cysts/masses on [place]. She was counseled in the office regarding alternatives to hysterectomy including medical management and a less major surgery and ultimately patient elected a hysterectomy. We plan for a laparoscopic-assisted vaginal hysterectomy with bilateral salpingectomy, mccall's, and cystoscopy. There is a possibility of ovarian removal if any abnormalities are noted. Risks discussed including infection, bleeding, damage to surrounding structures, need for additional procedures, the possibility of prolapse, the possibility this will not help pelvic pressure, and the possibility of finding a carcinoma and needing additional surgery. All questions answered. Consent signed. Proceed with above surgery. PCN allergic - plan for gent/clinda.  She has a h/o PE and  elevated caprini score of 8. She reportedly was tested for predisposition and was told she was neg and that PE was s/s immobility after surgery. We will plan for lovenox starting AM after surgery and continuing until 10-14 days postop proph dose.  She will continue her Lamictal 150mg , klonopin 1mg  qhs, and zoloft 25mg  qhs in the perioperative period.    Colin Benton Dafna Romo 07/27/2018,  9:00 AM   No updates to above H&P. Patient arrived NPO and was consented in PACU. Risks discussed including infection, bleeding, damage to surrounding structures, need for additional procedures, and all above risks. All questions answered. Consent signed. Proceed with above surgery.   Lucillie Garfinkel MD

## 2018-07-30 ENCOUNTER — Ambulatory Visit (INDEPENDENT_AMBULATORY_CARE_PROVIDER_SITE_OTHER): Payer: BLUE CROSS/BLUE SHIELD | Admitting: Physician Assistant

## 2018-07-30 ENCOUNTER — Encounter: Payer: Self-pay | Admitting: Physician Assistant

## 2018-07-30 VITALS — BP 129/88 | HR 75 | Ht 64.0 in | Wt 150.0 lb

## 2018-07-30 DIAGNOSIS — Z86711 Personal history of pulmonary embolism: Secondary | ICD-10-CM | POA: Diagnosis not present

## 2018-07-30 DIAGNOSIS — R971 Elevated cancer antigen 125 [CA 125]: Secondary | ICD-10-CM

## 2018-07-30 DIAGNOSIS — Z01818 Encounter for other preprocedural examination: Secondary | ICD-10-CM | POA: Diagnosis not present

## 2018-07-30 DIAGNOSIS — N939 Abnormal uterine and vaginal bleeding, unspecified: Secondary | ICD-10-CM | POA: Diagnosis not present

## 2018-07-30 MED ORDER — ENOXAPARIN SODIUM 40 MG/0.4ML ~~LOC~~ SOLN: 40.0000 mg | SUBCUTANEOUS | 0 refills | Status: DC

## 2018-07-30 NOTE — Progress Notes (Signed)
   Subjective:    Patient ID: Andrea Mcguire, female    DOB: 04-04-1976, 42 y.o.   MRN: [id number]  HPI  Pt is a 42 yo female with elevated CA125 and abnormal uterine bleeding who is scheduled to have hysterectomy on 08/08/18. She needs surgical clearance letter. She does have a hx of PE after surgery. Testing done with no blood abnormalities and determined clot due to immobilization. She need letter sent to surgeon with plan.   Pt has no concerns or complaints.   .. Active Ambulatory Problems    Diagnosis Date Noted  . Other pulmonary embolism and infarction 10/29/2008  . ACUTE MAXILLARY SINUSITIS 12/21/2008  . OTHER ACUTE SINUSITIS 09/24/2008  . ALLERGIC RHINITIS CAUSE UNSPECIFIED 07/27/2009  . PNEUMONIA, ORGANISM UNSPECIFIED 09/16/2008  . PLEURISY 11/04/2008  . FATIGUE 12/08/2008  . PARESTHESIA 12/08/2008  . DYSPNEA 09/16/2008  . VIRAL URI 11/02/2010  . DEPRESSION, MILD 12/19/2010  . Weight gain due to medication 05/20/2014  . History of pulmonary embolus (PE) 11/09/2014  . Anxiety state 11/09/2014  . MDD (major depressive disorder), recurrent episode, moderate ([place]) 07/26/2015  . Malaise and fatigue 07/26/2015  . PTSD (post-traumatic stress disorder) 07/26/2015  . Shingles 07/27/2015  . Insomnia 09/10/2015  . GAD (generalized anxiety disorder) 11/15/2015  . Mood changes 08/15/2016  . Elevated LDL cholesterol level 08/15/2016  . Hot flashes 08/17/2017  . Night sweats 08/17/2017  . Left lower quadrant pain 08/17/2017  . Elevated CA-125 08/01/2018  . Abnormal uterine bleeding (AUB) 08/01/2018   Resolved Ambulatory Problems    Diagnosis Date Noted  . Anxiety state, unspecified 06/04/2013   Past Medical History:  Diagnosis Date  . Anxiety   . Asthma   . Dyspareunia, female   . Elevated cancer antigen 125 (CA 125)   . Herpes simplex virus (HSV) infection   . Pulmonary embolism ([place])   . Vaginal Pap smear, abnormal       Review of Systems See HPI.      Objective:   Physical Exam  Constitutional: She is oriented to person, place, and time. She appears well-developed and well-nourished.  HENT:  Head: Normocephalic and atraumatic.  Cardiovascular: Normal rate and regular rhythm.  Pulmonary/Chest: Effort normal and breath sounds normal.  Neurological: She is alert and oriented to person, place, and time.  Psychiatric: She has a normal mood and affect. Her behavior is normal.          Assessment & Plan:  Marland KitchenMarland KitchenDiagnoses and all orders for this visit:  Preop examination -     CBC with Differential/Platelet -     COMPLETE METABOLIC PANEL WITH GFR -     Thrombin time -     Platelet function assay -     APTT  History of pulmonary embolism -     CBC with Differential/Platelet -     Thrombin time -     Platelet function assay -     APTT -     enoxaparin (LOVENOX) 40 MG/0.4ML injection; Inject 0.4 mLs (40 mg total) into the skin daily.  Elevated CA-125  Abnormal uterine bleeding (AUB)   Unclear if I am supposed to order labs for surgical clearance. She has pre op appt tomorrow. Printed what I would do. She can compare but she doesn't need to overlap labs.   I sent lovenox for after surgery for 10-14 days. Discussed signs of PE/DVT.   I will send letter for release for surgery.

## 2018-07-30 NOTE — Patient Instructions (Addendum)
Andrea Mcguire  07/30/2018      Your procedure is scheduled on  October 31st, 2019   Report to [facility]  at  5:30A.M.   Call this number if you have problems the morning of surgery:[phone]   OUR ADDRESS IS [address] [place], WE ARE LOCATED [facility] THE [facility] WITH [facility].   Remember:  Do not eat food or drink liquids after midnight.    Take these medicines the morning of surgery with A SIP OF WATER: sertraline (zoloft)   Do not wear jewelry, make-up or nail polish.  Do not wear lotions, powders, or perfumes, or deoderant.  Do not shave 48 hours prior to surgery.  Men may shave face and neck.  Do not bring valuables to the hospital.  [facility] is not responsible for any belongings or valuables.  Contacts, dentures or bridgework may not be worn into surgery.  Leave your suitcase in the car.  After surgery it may be brought to your room.  Patients discharged the day of surgery will not be allowed to drive home.   Special instructions:  See incentive spirometer instructions below  Please read over the following fact sheets that you were given:       [facility] - Preparing for Surgery Before surgery, you can play an important role.  Because skin is not sterile, your skin needs to be as free of germs as possible.  You can reduce the number of germs on your skin by washing with CHG (chlorahexidine gluconate) soap before surgery.  CHG is an antiseptic cleaner which kills germs and bonds with the skin to continue killing germs even after washing. Please DO NOT use if you have an allergy to CHG or antibacterial soaps.  If your skin becomes reddened/irritated stop using the CHG and inform your nurse when you arrive at Short Stay. Do not shave (including legs and underarms) for at least 48 hours prior to the first CHG shower.  You may shave your face/neck. Please follow these instructions carefully:  1.  Shower with CHG Soap the night before  surgery and the  morning of Surgery.  2.  If you choose to wash your hair, wash your hair first as usual with your  normal  shampoo.  3.  After you shampoo, rinse your hair and body thoroughly to remove the  shampoo.                           4.  Use CHG as you would any other liquid soap.  You can apply chg directly  to the skin and wash                       Gently with a scrungie or clean washcloth.  5.  Apply the CHG Soap to your body ONLY FROM THE NECK DOWN.   Do not use on face/ open                           Wound or open sores. Avoid contact with eyes, ears mouth and genitals (private parts).                       Wash face,  Genitals (private parts) with your normal soap.             6.  Wash  thoroughly, paying special attention to the area where your surgery  will be performed.  7.  Thoroughly rinse your body with warm water from the neck down.  8.  DO NOT shower/wash with your normal soap after using and rinsing off  the CHG Soap.                9.  Pat yourself dry with a clean towel.            10.  Wear clean pajamas.            11.  Place clean sheets on your bed the night of your first shower and do not  sleep with pets. Day of Surgery : Do not apply any lotions/deodorants the morning of surgery.  Please wear clean clothes to the hospital/surgery center.  FAILURE TO FOLLOW THESE INSTRUCTIONS MAY RESULT IN THE CANCELLATION OF YOUR SURGERY PATIENT SIGNATURE_________________________________  NURSE SIGNATURE__________________________________  ________________________________________________________________________   Andrea Mcguire  An incentive spirometer is a tool that can help keep your lungs clear and active. This tool measures how well you are filling your lungs with each breath. Taking long deep breaths may help reverse or decrease the chance of developing breathing (pulmonary) problems (especially infection) following:  A long period of time when you are unable to  move or be active. BEFORE THE PROCEDURE   If the spirometer includes an indicator to show your best effort, your nurse or respiratory therapist will set it to a desired goal.  If possible, sit up straight or lean slightly forward. Try not to slouch.  Hold the incentive spirometer in an upright position. INSTRUCTIONS FOR USE  1. Sit on the edge of your bed if possible, or sit up as far as you can in bed or on a chair. 2. Hold the incentive spirometer in an upright position. 3. Breathe out normally. 4. Place the mouthpiece in your mouth and seal your lips tightly around it. 5. Breathe in slowly and as deeply as possible, raising the piston or the ball toward the top of the column. 6. Hold your breath for 3-5 seconds or for as long as possible. Allow the piston or ball to fall to the bottom of the column. 7. Remove the mouthpiece from your mouth and breathe out normally. 8. Rest for a few seconds and repeat Steps 1 through 7 at least 10 times every 1-2 hours when you are awake. Take your time and take a few normal breaths between deep breaths. 9. The spirometer may include an indicator to show your best effort. Use the indicator as a goal to work toward during each repetition. 10. After each set of 10 deep breaths, practice coughing to be sure your lungs are clear. If you have an incision (the cut made at the time of surgery), support your incision when coughing by placing a pillow or rolled up towels firmly against it. Once you are able to get out of bed, walk around indoors and cough well. You may stop using the incentive spirometer when instructed by your caregiver.  RISKS AND COMPLICATIONS  Take your time so you do not get dizzy or light-headed.  If you are in pain, you may need to take or ask for pain medication before doing incentive spirometry. It is harder to take a deep breath if you are having pain. AFTER USE  Rest and breathe slowly and easily.  It can be helpful to keep track of  a log of your progress. Your  caregiver can provide you with a simple table to help with this. If you are using the spirometer at home, follow these instructions: [place] IF:   You are having difficultly using the spirometer.  You have trouble using the spirometer as often as instructed.  Your pain medication is not giving enough relief while using the spirometer.  You develop fever of 100.5 F (38.1 C) or higher. SEEK IMMEDIATE MEDICAL CARE IF:   You cough up bloody sputum that had not been present before.  You develop fever of 102 F (38.9 C) or greater.  You develop worsening pain at or near the incision site. MAKE SURE YOU:   Understand these instructions.  Will watch your condition.  Will get help right away if you are not doing well or get worse. Document Released: 02/05/2007 Document Revised: 12/18/2011 Document Reviewed: 04/08/2007 [facility] Patient Information 2014 [place], [place].   ________________________________________________________________________

## 2018-07-31 ENCOUNTER — Encounter (HOSPITAL_COMMUNITY)
Admission: RE | Admit: 2018-07-31 | Discharge: 2018-07-31 | Disposition: A | Discharge status: Home / Self Care | Payer: BLUE CROSS/BLUE SHIELD | Source: Ambulatory Visit | Attending: Obstetrics and Gynecology | Admitting: Obstetrics and Gynecology

## 2018-07-31 ENCOUNTER — Encounter (HOSPITAL_COMMUNITY): Payer: Self-pay

## 2018-07-31 ENCOUNTER — Other Ambulatory Visit: Payer: Self-pay

## 2018-07-31 DIAGNOSIS — Z01812 Encounter for preprocedural laboratory examination: Secondary | ICD-10-CM | POA: Insufficient documentation

## 2018-07-31 HISTORY — DX: Unspecified dyspareunia: N94.10

## 2018-07-31 HISTORY — DX: Abnormal uterine and vaginal bleeding, unspecified: N93.9

## 2018-07-31 HISTORY — DX: Elevated cancer antigen 125 (CA 125): R97.1

## 2018-07-31 LAB — CBC
HCT: 39.9 % (ref 36.0–46.0)
Hemoglobin: 13 g/dL (ref 12.0–15.0)
MCH: 30.6 pg (ref 26.0–34.0)
MCHC: 32.6 g/dL (ref 30.0–36.0)
MCV: 93.9 fL (ref 80.0–100.0)
Platelets: 233 10*3/uL (ref 150–400)
RBC: 4.25 MIL/uL (ref 3.87–5.11)
RDW: 12.1 % (ref 11.5–15.5)
WBC: 5.7 10*3/uL (ref 4.0–10.5)
nRBC: 0 % (ref 0.0–0.2)

## 2018-07-31 LAB — COMPREHENSIVE METABOLIC PANEL
ALT: 13 U/L (ref 0–44)
AST: 17 U/L (ref 15–41)
Albumin: 4.2 g/dL (ref 3.5–5.0)
Alkaline Phosphatase: 32 U/L — ABNORMAL LOW (ref 38–126)
Anion gap: 8 (ref 5–15)
BUN: 14 mg/dL (ref 6–20)
CO2: 26 mmol/L (ref 22–32)
Calcium: 9 mg/dL (ref 8.9–10.3)
Chloride: 105 mmol/L (ref 98–111)
Creatinine, Ser: 0.7 mg/dL (ref 0.44–1.00)
GFR calc Af Amer: >60 mL/min (ref >60)
GFR calc non Af Amer: >60 mL/min (ref >60)
Glucose, Bld: 91 mg/dL (ref 70–99)
Potassium: 4.4 mmol/L (ref 3.5–5.1)
Sodium: 139 mmol/L (ref 135–145)
Total Bilirubin: 0.7 mg/dL (ref 0.3–1.2)
Total Protein: 6.9 g/dL (ref 6.5–8.1)

## 2018-07-31 LAB — ABO/RH: ABO/RH(D): O POS

## 2018-08-01 ENCOUNTER — Telehealth: Payer: Self-pay

## 2018-08-01 ENCOUNTER — Encounter: Payer: Self-pay | Admitting: Physician Assistant

## 2018-08-01 DIAGNOSIS — N939 Abnormal uterine and vaginal bleeding, unspecified: Secondary | ICD-10-CM | POA: Insufficient documentation

## 2018-08-01 DIAGNOSIS — R971 Elevated cancer antigen 125 [CA 125]: Secondary | ICD-10-CM | POA: Insufficient documentation

## 2018-08-01 NOTE — Progress Notes (Signed)
Cardiac clearance received and placed on patient chart.

## 2018-08-01 NOTE — Telephone Encounter (Signed)
Thanks

## 2018-08-01 NOTE — Telephone Encounter (Signed)
Printed surgical clearance letter for patient and faxed it over to Dr. Alfred Levins at [facility].

## 2018-08-07 MED ORDER — GENTAMICIN SULFATE 40 MG/ML IJ SOLN
INTRAVENOUS | Status: AC
  Administered 2018-08-08: 340 mg via INTRAVENOUS
  Filled 2018-08-07 (×3): qty 8.5

## 2018-08-07 NOTE — Anesthesia Preprocedure Evaluation (Addendum)
Anesthesia Evaluation  Patient identified by MRN, date of birth, ID band Patient awake    Reviewed: Allergy & Precautions, NPO status , Patient's Chart, lab work & pertinent test results  History of Anesthesia Complications Negative for: history of anesthetic complications  Airway Mallampati: II  TM Distance: >3 FB Neck ROM: Full    Dental  (+) Dental Advisory Given, Teeth Intact   Pulmonary COPD,  COPD inhaler, former smoker, PE   breath sounds clear to auscultation       Cardiovascular negative cardio ROS   Rhythm:Regular Rate:Normal     Neuro/Psych PSYCHIATRIC DISORDERS (PTSD) Anxiety Depression negative neurological ROS     GI/Hepatic negative GI ROS, Neg liver ROS,   Endo/Other  negative endocrine ROS  Renal/GU negative Renal ROS     Musculoskeletal   Abdominal   Peds  Hematology negative hematology ROS (+)   Anesthesia Other Findings   Reproductive/Obstetrics                           Anesthesia Physical Anesthesia Plan  ASA: II  Anesthesia Plan: General   Post-op Pain Management:    Induction: Intravenous  PONV Risk Score and Plan: 4 or greater and Scopolamine patch - Pre-op, Dexamethasone and Ondansetron  Airway Management Planned: Oral ETT  Additional Equipment:   Intra-op Plan:   Post-operative Plan: Extubation in OR  Informed Consent: I have reviewed the patients History and Physical, chart, labs and discussed the procedure including the risks, benefits and alternatives for the proposed anesthesia with the patient or authorized representative who has indicated his/her understanding and acceptance.   Dental advisory given  Plan Discussed with: CRNA and Surgeon  Anesthesia Plan Comments: (Plan routine monitors, GETA)        Anesthesia Quick Evaluation

## 2018-08-08 ENCOUNTER — Observation Stay (HOSPITAL_BASED_OUTPATIENT_CLINIC_OR_DEPARTMENT_OTHER): Payer: BLUE CROSS/BLUE SHIELD | Admitting: Anesthesiology

## 2018-08-08 ENCOUNTER — Encounter (HOSPITAL_BASED_OUTPATIENT_CLINIC_OR_DEPARTMENT_OTHER): Payer: Self-pay

## 2018-08-08 ENCOUNTER — Inpatient Hospital Stay (HOSPITAL_BASED_OUTPATIENT_CLINIC_OR_DEPARTMENT_OTHER)
Admission: RE | Admit: 2018-08-08 | Discharge: 2018-08-10 | DRG: 743 | Disposition: A | Discharge status: Home / Self Care | Payer: BLUE CROSS/BLUE SHIELD | Source: Other Acute Inpatient Hospital | Attending: Obstetrics and Gynecology | Admitting: Obstetrics and Gynecology

## 2018-08-08 ENCOUNTER — Other Ambulatory Visit: Payer: Self-pay

## 2018-08-08 ENCOUNTER — Encounter (HOSPITAL_COMMUNITY)
Admission: RE | Disposition: A | Discharge status: Home / Self Care | Payer: Self-pay | Source: Other Acute Inpatient Hospital | Attending: Obstetrics and Gynecology

## 2018-08-08 DIAGNOSIS — N852 Hypertrophy of uterus: Secondary | ICD-10-CM | POA: Diagnosis present

## 2018-08-08 DIAGNOSIS — N801 Endometriosis of ovary: Secondary | ICD-10-CM | POA: Diagnosis not present

## 2018-08-08 DIAGNOSIS — K660 Peritoneal adhesions (postprocedural) (postinfection): Secondary | ICD-10-CM | POA: Diagnosis not present

## 2018-08-08 DIAGNOSIS — N939 Abnormal uterine and vaginal bleeding, unspecified: Secondary | ICD-10-CM | POA: Diagnosis present

## 2018-08-08 DIAGNOSIS — N800 Endometriosis of uterus: Principal | ICD-10-CM | POA: Diagnosis present

## 2018-08-08 DIAGNOSIS — Z86711 Personal history of pulmonary embolism: Secondary | ICD-10-CM

## 2018-08-08 DIAGNOSIS — Z5331 Laparoscopic surgical procedure converted to open procedure: Secondary | ICD-10-CM

## 2018-08-08 DIAGNOSIS — N83202 Unspecified ovarian cyst, left side: Secondary | ICD-10-CM | POA: Diagnosis present

## 2018-08-08 DIAGNOSIS — N941 Unspecified dyspareunia: Secondary | ICD-10-CM | POA: Diagnosis not present

## 2018-08-08 DIAGNOSIS — R971 Elevated cancer antigen 125 [CA 125]: Secondary | ICD-10-CM | POA: Diagnosis present

## 2018-08-08 DIAGNOSIS — N805 Endometriosis of intestine: Secondary | ICD-10-CM | POA: Diagnosis present

## 2018-08-08 DIAGNOSIS — Z888 Allergy status to other drugs, medicaments and biological substances status: Secondary | ICD-10-CM

## 2018-08-08 DIAGNOSIS — R9389 Abnormal findings on diagnostic imaging of other specified body structures: Secondary | ICD-10-CM | POA: Diagnosis not present

## 2018-08-08 DIAGNOSIS — N736 Female pelvic peritoneal adhesions (postinfective): Secondary | ICD-10-CM | POA: Diagnosis not present

## 2018-08-08 DIAGNOSIS — J449 Chronic obstructive pulmonary disease, unspecified: Secondary | ICD-10-CM | POA: Diagnosis present

## 2018-08-08 DIAGNOSIS — Z880 Allergy status to penicillin: Secondary | ICD-10-CM

## 2018-08-08 DIAGNOSIS — R102 Pelvic and perineal pain: Secondary | ICD-10-CM | POA: Diagnosis not present

## 2018-08-08 DIAGNOSIS — Z87891 Personal history of nicotine dependence: Secondary | ICD-10-CM

## 2018-08-08 DIAGNOSIS — Z90710 Acquired absence of both cervix and uterus: Secondary | ICD-10-CM | POA: Diagnosis present

## 2018-08-08 DIAGNOSIS — N840 Polyp of corpus uteri: Secondary | ICD-10-CM | POA: Diagnosis not present

## 2018-08-08 DIAGNOSIS — N809 Endometriosis, unspecified: Secondary | ICD-10-CM | POA: Diagnosis not present

## 2018-08-08 HISTORY — DX: Unspecified asthma, uncomplicated: J45.909

## 2018-08-08 HISTORY — DX: Herpesviral infection, unspecified: B00.9

## 2018-08-08 HISTORY — DX: Anxiety disorder, unspecified: F41.9

## 2018-08-08 HISTORY — PX: HYSTERECTOMY ABDOMINAL WITH SALPINGECTOMY: SHX6725

## 2018-08-08 HISTORY — DX: Unspecified abnormal cytological findings in specimens from vagina: R87.629

## 2018-08-08 HISTORY — DX: Other pulmonary embolism without acute cor pulmonale: I26.99

## 2018-08-08 HISTORY — PX: LAPAROSCOPIC VAGINAL HYSTERECTOMY WITH SALPINGECTOMY: SHX6680

## 2018-08-08 HISTORY — PX: CYSTOSCOPY: SHX5120

## 2018-08-08 LAB — TYPE AND SCREEN
ABO/RH(D): O POS
Antibody Screen: NEGATIVE

## 2018-08-08 LAB — POCT PREGNANCY, URINE: Preg Test, Ur: NEGATIVE

## 2018-08-08 SURGERY — HYSTERECTOMY, VAGINAL, LAPAROSCOPY-ASSISTED, WITH SALPINGECTOMY
Anesthesia: General

## 2018-08-08 MED ORDER — ROCURONIUM BROMIDE 10 MG/ML (PF) SYRINGE
PREFILLED_SYRINGE | INTRAVENOUS | Status: AC
  Filled 2018-08-08: qty 10

## 2018-08-08 MED ORDER — ACETAMINOPHEN 500 MG PO TABS
1000.0000 mg | ORAL_TABLET | Freq: Once | ORAL | Status: AC
  Administered 2018-08-08: 1000 mg via ORAL
  Filled 2018-08-08: qty 2

## 2018-08-08 MED ORDER — HYDROMORPHONE HCL 1 MG/ML IJ SOLN
INTRAMUSCULAR | Status: AC
  Filled 2018-08-08: qty 1

## 2018-08-08 MED ORDER — PROPOFOL 10 MG/ML IV BOLUS
INTRAVENOUS | Status: DC | PRN
  Administered 2018-08-08: 200 mg via INTRAVENOUS

## 2018-08-08 MED ORDER — ONDANSETRON HCL 4 MG/2ML IJ SOLN
4.0000 mg | Freq: Four times a day (QID) | INTRAMUSCULAR | Status: DC | PRN
  Administered 2018-08-09: 4 mg via INTRAVENOUS
  Filled 2018-08-08 (×2): qty 2

## 2018-08-08 MED ORDER — ENOXAPARIN SODIUM 40 MG/0.4ML ~~LOC~~ SOLN
40.0000 mg | SUBCUTANEOUS | Status: DC
  Filled 2018-08-08: qty 0.4

## 2018-08-08 MED ORDER — ACETAMINOPHEN 500 MG PO TABS
ORAL_TABLET | ORAL | Status: AC
  Filled 2018-08-08: qty 2

## 2018-08-08 MED ORDER — DEXTROSE-NACL 5-0.45 % IV SOLN
125.0000 mL/h | INTRAVENOUS | Status: AC
  Administered 2018-08-08 – 2018-08-09 (×3): 125 mL/h via INTRAVENOUS
  Filled 2018-08-08 (×4): qty 1000

## 2018-08-08 MED ORDER — KETOROLAC TROMETHAMINE 30 MG/ML IJ SOLN
INTRAMUSCULAR | Status: AC
  Filled 2018-08-08: qty 1

## 2018-08-08 MED ORDER — DEXAMETHASONE SODIUM PHOSPHATE 10 MG/ML IJ SOLN
INTRAMUSCULAR | Status: AC
  Filled 2018-08-08: qty 1

## 2018-08-08 MED ORDER — LAMOTRIGINE 25 MG PO TABS
150.0000 mg | ORAL_TABLET | Freq: Every day | ORAL | Status: DC
  Administered 2018-08-08 – 2018-08-09 (×2): 150 mg via ORAL
  Filled 2018-08-08 (×2): qty 1

## 2018-08-08 MED ORDER — FENTANYL CITRATE (PF) 250 MCG/5ML IJ SOLN
INTRAMUSCULAR | Status: AC
  Filled 2018-08-08: qty 5

## 2018-08-08 MED ORDER — HYDROMORPHONE HCL 1 MG/ML IJ SOLN
0.2500 mg | INTRAMUSCULAR | Status: DC
  Administered 2018-08-08 (×2): 0.25 mg via INTRAVENOUS
  Filled 2018-08-08: qty 5

## 2018-08-08 MED ORDER — ONDANSETRON HCL 4 MG/2ML IJ SOLN
INTRAMUSCULAR | Status: AC
  Filled 2018-08-08: qty 2

## 2018-08-08 MED ORDER — ACETAMINOPHEN 10 MG/ML IV SOLN
1000.0000 mg | Freq: Four times a day (QID) | INTRAVENOUS | Status: AC
  Filled 2018-08-08: qty 100

## 2018-08-08 MED ORDER — MEPERIDINE HCL 25 MG/ML IJ SOLN
6.2500 mg | INTRAMUSCULAR | Status: DC | PRN
  Filled 2018-08-08: qty 1

## 2018-08-08 MED ORDER — MIDAZOLAM HCL 2 MG/2ML IJ SOLN
INTRAMUSCULAR | Status: AC
  Filled 2018-08-08: qty 2

## 2018-08-08 MED ORDER — KETOROLAC TROMETHAMINE 30 MG/ML IJ SOLN
30.0000 mg | Freq: Once | INTRAMUSCULAR | Status: AC
  Administered 2018-08-08: 30 mg via INTRAVENOUS
  Filled 2018-08-08: qty 1

## 2018-08-08 MED ORDER — ROCURONIUM BROMIDE 10 MG/ML (PF) SYRINGE
PREFILLED_SYRINGE | INTRAVENOUS | Status: DC | PRN
  Administered 2018-08-08: 50 mg via INTRAVENOUS
  Administered 2018-08-08: 10 mg via INTRAVENOUS

## 2018-08-08 MED ORDER — LIDOCAINE 2% (20 MG/ML) 5 ML SYRINGE
INTRAMUSCULAR | Status: DC | PRN
  Administered 2018-08-08: 20 mg via INTRAVENOUS

## 2018-08-08 MED ORDER — SIMETHICONE 80 MG PO CHEW
80.0000 mg | CHEWABLE_TABLET | Freq: Four times a day (QID) | ORAL | Status: DC | PRN
  Filled 2018-08-08: qty 1

## 2018-08-08 MED ORDER — SENNOSIDES-DOCUSATE SODIUM 8.6-50 MG PO TABS
1.0000 | ORAL_TABLET | Freq: Every evening | ORAL | Status: DC | PRN
  Filled 2018-08-08: qty 1

## 2018-08-08 MED ORDER — PROPOFOL 10 MG/ML IV BOLUS
INTRAVENOUS | Status: AC
  Filled 2018-08-08: qty 40

## 2018-08-08 MED ORDER — HYDROMORPHONE HCL 1 MG/ML IJ SOLN
0.2500 mg | INTRAMUSCULAR | Status: DC | PRN
  Administered 2018-08-08: 0.5 mg via INTRAVENOUS
  Administered 2018-08-08 (×2): 0.25 mg via INTRAVENOUS
  Administered 2018-08-08 (×3): 0.5 mg via INTRAVENOUS
  Filled 2018-08-08: qty 0.5

## 2018-08-08 MED ORDER — MIDAZOLAM HCL 2 MG/2ML IJ SOLN
INTRAMUSCULAR | Status: DC | PRN
  Administered 2018-08-08: 2 mg via INTRAVENOUS

## 2018-08-08 MED ORDER — HYDROMORPHONE HCL 1 MG/ML IJ SOLN
0.2000 mg | INTRAMUSCULAR | Status: DC | PRN
  Administered 2018-08-08 – 2018-08-09 (×6): 0.5 mg via INTRAVENOUS
  Filled 2018-08-08 (×3): qty 1

## 2018-08-08 MED ORDER — ALUM & MAG HYDROXIDE-SIMETH 200-200-20 MG/5ML PO SUSP
30.0000 mL | ORAL | Status: DC | PRN
  Filled 2018-08-08: qty 30

## 2018-08-08 MED ORDER — LORATADINE 10 MG PO TABS
10.0000 mg | ORAL_TABLET | Freq: Every day | ORAL | Status: DC
  Administered 2018-08-09: 10 mg via ORAL
  Filled 2018-08-08 (×3): qty 1

## 2018-08-08 MED ORDER — SUGAMMADEX SODIUM 200 MG/2ML IV SOLN
INTRAVENOUS | Status: AC
  Filled 2018-08-08: qty 2

## 2018-08-08 MED ORDER — TRAMADOL HCL 50 MG PO TABS
ORAL_TABLET | ORAL | Status: AC
  Filled 2018-08-08: qty 1

## 2018-08-08 MED ORDER — PROMETHAZINE HCL 25 MG/ML IJ SOLN
6.2500 mg | INTRAMUSCULAR | Status: DC | PRN
  Filled 2018-08-08: qty 1

## 2018-08-08 MED ORDER — ALBUTEROL SULFATE HFA 108 (90 BASE) MCG/ACT IN AERS
2.0000 | INHALATION_SPRAY | RESPIRATORY_TRACT | Status: DC | PRN
  Filled 2018-08-08: qty 6.7

## 2018-08-08 MED ORDER — TRAMADOL HCL 50 MG PO TABS
50.0000 mg | ORAL_TABLET | Freq: Four times a day (QID) | ORAL | Status: DC | PRN
  Administered 2018-08-08 (×2): 50 mg via ORAL
  Filled 2018-08-08: qty 1

## 2018-08-08 MED ORDER — ARTIFICIAL TEARS OPHTHALMIC OINT
TOPICAL_OINTMENT | OPHTHALMIC | Status: AC
  Filled 2018-08-08: qty 3.5

## 2018-08-08 MED ORDER — ONDANSETRON HCL 4 MG PO TABS
4.0000 mg | ORAL_TABLET | Freq: Four times a day (QID) | ORAL | Status: DC | PRN
  Filled 2018-08-08: qty 1

## 2018-08-08 MED ORDER — OXYCODONE HCL 5 MG PO TABS
ORAL_TABLET | ORAL | Status: AC
  Filled 2018-08-08: qty 1

## 2018-08-08 MED ORDER — LACTATED RINGERS IV SOLN
INTRAVENOUS | Status: DC
  Administered 2018-08-08: 08:00:00 via INTRAVENOUS
  Administered 2018-08-08: 1000 mL via INTRAVENOUS
  Administered 2018-08-08: 10:00:00 via INTRAVENOUS
  Filled 2018-08-08: qty 1000

## 2018-08-08 MED ORDER — SUGAMMADEX SODIUM 200 MG/2ML IV SOLN
INTRAVENOUS | Status: DC | PRN
  Administered 2018-08-08: 150 mg via INTRAVENOUS

## 2018-08-08 MED ORDER — OXYCODONE HCL 5 MG PO TABS
5.0000 mg | ORAL_TABLET | ORAL | Status: DC | PRN
  Administered 2018-08-08 – 2018-08-10 (×10): 5 mg via ORAL
  Filled 2018-08-08 (×6): qty 1

## 2018-08-08 MED ORDER — ACETAMINOPHEN 500 MG PO TABS
1000.0000 mg | ORAL_TABLET | Freq: Four times a day (QID) | ORAL | Status: DC
  Administered 2018-08-08 – 2018-08-09 (×7): 1000 mg via ORAL
  Filled 2018-08-08 (×5): qty 2

## 2018-08-08 MED ORDER — PANTOPRAZOLE SODIUM 40 MG PO TBEC
40.0000 mg | DELAYED_RELEASE_TABLET | Freq: Every day | ORAL | Status: DC
  Administered 2018-08-08 – 2018-08-09 (×2): 40 mg via ORAL
  Filled 2018-08-08 (×3): qty 1

## 2018-08-08 MED ORDER — CLONAZEPAM 1 MG PO TABS
1.0000 mg | ORAL_TABLET | Freq: Every day | ORAL | Status: DC
  Administered 2018-08-08 – 2018-08-09 (×2): 1 mg via ORAL
  Filled 2018-08-08 (×2): qty 1

## 2018-08-08 MED ORDER — FENTANYL CITRATE (PF) 100 MCG/2ML IJ SOLN
INTRAMUSCULAR | Status: DC | PRN
  Administered 2018-08-08 (×2): 100 ug via INTRAVENOUS
  Administered 2018-08-08: 50 ug via INTRAVENOUS

## 2018-08-08 MED ORDER — DEXAMETHASONE SODIUM PHOSPHATE 10 MG/ML IJ SOLN
INTRAMUSCULAR | Status: DC | PRN
  Administered 2018-08-08: 10 mg via INTRAVENOUS

## 2018-08-08 MED ORDER — LIDOCAINE 2% (20 MG/ML) 5 ML SYRINGE
INTRAMUSCULAR | Status: AC
  Filled 2018-08-08: qty 5

## 2018-08-08 MED ORDER — MIDAZOLAM HCL 2 MG/2ML IJ SOLN
0.5000 mg | Freq: Once | INTRAMUSCULAR | Status: DC | PRN
  Filled 2018-08-08: qty 2

## 2018-08-08 MED ORDER — SERTRALINE HCL 25 MG PO TABS
25.0000 mg | ORAL_TABLET | Freq: Every day | ORAL | Status: DC
  Administered 2018-08-08 – 2018-08-09 (×2): 25 mg via ORAL
  Filled 2018-08-08 (×3): qty 1

## 2018-08-08 MED ORDER — DOCUSATE SODIUM 100 MG PO CAPS
100.0000 mg | ORAL_CAPSULE | Freq: Two times a day (BID) | ORAL | Status: DC
  Administered 2018-08-08 – 2018-08-09 (×3): 100 mg via ORAL
  Filled 2018-08-08 (×5): qty 1

## 2018-08-08 MED ORDER — MIDAZOLAM HCL 2 MG/2ML IJ SOLN
1.0000 mg | Freq: Once | INTRAMUSCULAR | Status: AC
  Administered 2018-08-08: 1 mg via INTRAVENOUS
  Filled 2018-08-08: qty 1

## 2018-08-08 MED ORDER — SCOPOLAMINE 1 MG/3DAYS TD PT72
1.0000 | MEDICATED_PATCH | TRANSDERMAL | Status: DC
  Administered 2018-08-08: 1.5 mg via TRANSDERMAL
  Filled 2018-08-08: qty 1

## 2018-08-08 MED ORDER — ONDANSETRON HCL 4 MG/2ML IJ SOLN
INTRAMUSCULAR | Status: DC | PRN
  Administered 2018-08-08: 4 mg via INTRAVENOUS

## 2018-08-08 SURGICAL SUPPLY — 63 items
ADH SKN CLS APL DERMABOND .7 (GAUZE/BANDAGES/DRESSINGS) ×3
APL SKNCLS STERI-STRIP NONHPOA (GAUZE/BANDAGES/DRESSINGS) ×3
BENZOIN TINCTURE PRP APPL 2/3 (GAUZE/BANDAGES/DRESSINGS) ×1 IMPLANT
BLADE SURG 10 STRL SS (BLADE) ×2 IMPLANT
CABLE HIGH FREQUENCY MONO STRZ (ELECTRODE) IMPLANT
CANISTER SUCT 3000ML PPV (MISCELLANEOUS) ×4 IMPLANT
COVER BACK TABLE 60X90IN (DRAPES) ×4 IMPLANT
COVER MAYO STAND STRL (DRAPES) ×8 IMPLANT
COVER WAND RF STERILE (DRAPES) ×4 IMPLANT
DECANTER SPIKE VIAL GLASS SM (MISCELLANEOUS) IMPLANT
DERMABOND ADVANCED (GAUZE/BANDAGES/DRESSINGS) ×1
DERMABOND ADVANCED .7 DNX12 (GAUZE/BANDAGES/DRESSINGS) IMPLANT
DRSG OPSITE POSTOP 3X4 (GAUZE/BANDAGES/DRESSINGS) ×4 IMPLANT
DRSG OPSITE POSTOP 4X10 (GAUZE/BANDAGES/DRESSINGS) ×1 IMPLANT
DURAPREP 26ML APPLICATOR (WOUND CARE) ×4 IMPLANT
ELECT REM PT RETURN 9FT ADLT (ELECTROSURGICAL) ×4
ELECTRODE REM PT RTRN 9FT ADLT (ELECTROSURGICAL) ×3 IMPLANT
GAUZE 4X4 16PLY RFD (DISPOSABLE) ×3 IMPLANT
GLOVE BIO SURGEON STRL SZ 6.5 (GLOVE) ×12 IMPLANT
GLOVE BIOGEL PI IND STRL 6.5 (GLOVE) ×6 IMPLANT
GLOVE BIOGEL PI INDICATOR 6.5 (GLOVE) ×2
GLOVE ECLIPSE 6.5 STRL STRAW (GLOVE) ×8 IMPLANT
HEMOSTAT ARISTA ABSORB 3G PWDR (MISCELLANEOUS) ×1 IMPLANT
HOLDER FOLEY CATH W/STRAP (MISCELLANEOUS) ×1 IMPLANT
IV NS IRRIG 3000ML ARTHROMATIC (IV SOLUTION) IMPLANT
KIT TURNOVER CYSTO (KITS) ×4 IMPLANT
LIGASURE IMPACT 36 18CM CVD LR (INSTRUMENTS) ×1 IMPLANT
LIGASURE LAP L-HOOKWIRE 5 44CM (INSTRUMENTS) ×1 IMPLANT
NDL INSUFFLATION 14GA 150MM (NEEDLE) IMPLANT
NEEDLE INSUFFLATION 120MM (ENDOMECHANICALS) ×4 IMPLANT
NEEDLE INSUFFLATION 14GA 150MM (NEEDLE) IMPLANT
NS IRRIG 500ML POUR BTL (IV SOLUTION) ×4 IMPLANT
PACK LAVH (CUSTOM PROCEDURE TRAY) ×4 IMPLANT
PACK ROBOTIC GOWN (GOWN DISPOSABLE) ×4 IMPLANT
PACK TRENDGUARD 450 HYBRID PRO (MISCELLANEOUS) ×3 IMPLANT
PAD OB MATERNITY 4.3X12.25 (PERSONAL CARE ITEMS) ×4 IMPLANT
PAD PREP 24X48 CUFFED NSTRL (MISCELLANEOUS) ×4 IMPLANT
RETRACTOR WND ALEXIS 25 LRG (MISCELLANEOUS) IMPLANT
RTRCTR WOUND ALEXIS 25CM LRG (MISCELLANEOUS) ×4
SET IRRIG TUBING LAPAROSCOPIC (IRRIGATION / IRRIGATOR) IMPLANT
SET IRRIG Y TYPE TUR BLADDER L (SET/KITS/TRAYS/PACK) ×1 IMPLANT
SPONGE LAP 18X18 X RAY DECT (DISPOSABLE) ×3 IMPLANT
STRIP CLOSURE SKIN 1/2X4 (GAUZE/BANDAGES/DRESSINGS) ×1 IMPLANT
SUT PLAIN 2 0 XLH (SUTURE) ×1 IMPLANT
SUT VIC AB 0 CT1 18XCR BRD8 (SUTURE) ×3 IMPLANT
SUT VIC AB 0 CT1 36 (SUTURE) ×5 IMPLANT
SUT VIC AB 0 CT1 8-18 (SUTURE) ×12
SUT VIC AB 2-0 CT1 (SUTURE) IMPLANT
SUT VIC AB 2-0 SH 27 (SUTURE) ×4
SUT VIC AB 2-0 SH 27XBRD (SUTURE) ×3 IMPLANT
SUT VIC AB 2-0 UR6 27 (SUTURE) IMPLANT
SUT VICRYL 0 TIES 12 18 (SUTURE) ×4 IMPLANT
SUT VICRYL 0 UR6 27IN ABS (SUTURE) ×4 IMPLANT
SUT VICRYL 4-0 PS2 18IN ABS (SUTURE) ×5 IMPLANT
SYR BULB IRRIGATION 50ML (SYRINGE) ×1 IMPLANT
TOWEL OR 17X24 6PK STRL BLUE (TOWEL DISPOSABLE) ×12 IMPLANT
TRAY FOLEY W/BAG SLVR 14FR (SET/KITS/TRAYS/PACK) ×4 IMPLANT
TRENDGUARD 450 HYBRID PRO PACK (MISCELLANEOUS) ×4
TROCAR XCEL NON-BLD 11X100MML (ENDOMECHANICALS) ×4 IMPLANT
TROCAR XCEL NON-BLD 5MMX100MML (ENDOMECHANICALS) ×4 IMPLANT
TUBING INSUF HEATED (TUBING) ×4 IMPLANT
WARMER LAPAROSCOPE (MISCELLANEOUS) ×4 IMPLANT
WATER STERILE IRR 500ML POUR (IV SOLUTION) IMPLANT

## 2018-08-08 NOTE — Anesthesia Procedure Notes (Signed)
Procedure Name: Intubation Date/Time: 08/08/2018 7:31 AM Performed by: Wanita Chamberlain, CRNA Pre-anesthesia Checklist: Patient identified, Timeout performed, Emergency Drugs available, Suction available and Patient being monitored Patient Re-evaluated:Patient Re-evaluated prior to induction Oxygen Delivery Method: Circle system utilized Preoxygenation: Pre-oxygenation with 100% oxygen Induction Type: IV induction Ventilation: Mask ventilation without difficulty Laryngoscope Size: Mac and 3 Grade View: Grade I Tube type: Oral Tube size: 7.0 mm Number of attempts: 1 Airway Equipment and Method: Stylet Placement Confirmation: ETT inserted through vocal cords under direct vision,  positive ETCO2,  CO2 detector and breath sounds checked- equal and bilateral Secured at: 21 cm Tube secured with: Tape Dental Injury: Teeth and Oropharynx as per pre-operative assessment

## 2018-08-08 NOTE — Transfer of Care (Signed)
Immediate Anesthesia Transfer of Care Note  Patient: Andrea Mcguire  Procedure(s) Performed: ATTEMPTED LAPAROSCOPIC ASSISTED VAGINAL HYSTERECTOMY WITH SALPINGECTOMY, mccalls (Bilateral ) CYSTOSCOPY (N/A ) HYSTERECTOMY ABDOMINAL WITH BILATERAL SALPINGECTOMY  Patient Location: PACU  Anesthesia Type:General  Level of Consciousness: awake, alert , oriented and patient cooperative  Airway & Oxygen Therapy: Patient Spontanous Breathing and Patient connected to nasal cannula oxygen  Post-op Assessment: Report given to RN and Post -op Vital signs reviewed and stable  Post vital signs: Reviewed and stable  Last Vitals:  Vitals Value Taken Time  BP 139/79 08/08/2018  9:40 AM  Temp    Pulse 80 08/08/2018  9:41 AM  Resp 12 08/08/2018  9:41 AM  SpO2 100 % 08/08/2018  9:41 AM  Vitals shown include unvalidated device data.  Last Pain:  Vitals:   08/08/18 0609  TempSrc:   PainSc: 0-No pain      Patients Stated Pain Goal: 5 (97/67/34 1937)  Complications: No apparent anesthesia complications

## 2018-08-08 NOTE — Progress Notes (Signed)
S: Feeling overall fine. Attempted to ambulate - felt dizzy and then got nervous - felt very anxious and then started to breath quickly/couldn't catch her breath. Mild 2/10 pain present when pressed on area under her right clavicle. With prior PE felt dizzy with severe CP and then dx with PE. Pain now resolved and feels more calm. Abdominal pain with moderate control - feels sore. No flatus yet. Ate full dinner of mac n cheese, mashed potatoes, grilled cheese, and cake. No N/V.   O: AFVSS o2 100% Gen: Conversive, laughing, talkative Pulm: NOWB CTAB CV: Reg rate Abd: soft, minimally distended, appropriately ttp Incisions c/d/i GYN: min bleeding  A/P: 42yo POD#0 s/p DSC LSC with laparotomy TAH BS and cystoscopy for newly dx stage IV endometriosis. Exam benign - progressing appropriately.   # Postop - general diet - foley out in AM - pain c/w PO pain meds - ambulation - CBC in AM  # Mood d/o:  - Cont home meds  # CP  - seems MSK related, now resolved completely. However, will keep close eye given h/o PE  # H/o PE - Plan to restart lovenox in AM when bleeding risk lower and CBC back - SCDs

## 2018-08-08 NOTE — Progress Notes (Signed)
D; Ambulated pt up to the out of door, then c/o dizziness. C/o Rt Upper chest pain, no difference pain than  on admition that hard to tell muscle pain or lung. Back to bed. Applied SCD on, call bell in reach. Will monitor closely.

## 2018-08-08 NOTE — Anesthesia Postprocedure Evaluation (Signed)
Anesthesia Post Note  Patient: Andrea Mcguire  Procedure(s) Performed: ATTEMPTED LAPAROSCOPIC ASSISTED VAGINAL HYSTERECTOMY WITH SALPINGECTOMY, mccalls (Bilateral ) CYSTOSCOPY (N/A ) HYSTERECTOMY ABDOMINAL WITH BILATERAL SALPINGECTOMY     Patient location during evaluation: PACU Anesthesia Type: General Level of consciousness: awake and alert, oriented and patient cooperative Pain management: pain level controlled (pain much improved) Vital Signs Assessment: post-procedure vital signs reviewed and stable Respiratory status: spontaneous breathing, nonlabored ventilation, respiratory function stable and patient connected to nasal cannula oxygen Cardiovascular status: blood pressure returned to baseline and stable Postop Assessment: no apparent nausea or vomiting Anesthetic complications: no    Last Vitals:  Vitals:   08/08/18 1214 08/08/18 1315  BP: 115/72 115/75  Pulse: 78 82  Resp: 18 14  Temp:  36.5 C  SpO2: 99% 100%    Last Pain:  Vitals:   08/08/18 1328  TempSrc:   PainSc: 7                  Cherie Lasalle,E. Kaspar Albornoz

## 2018-08-09 ENCOUNTER — Encounter (HOSPITAL_BASED_OUTPATIENT_CLINIC_OR_DEPARTMENT_OTHER): Payer: Self-pay | Admitting: Obstetrics and Gynecology

## 2018-08-09 ENCOUNTER — Other Ambulatory Visit: Payer: Self-pay

## 2018-08-09 DIAGNOSIS — N800 Endometriosis of uterus: Secondary | ICD-10-CM | POA: Diagnosis present

## 2018-08-09 DIAGNOSIS — Z5331 Laparoscopic surgical procedure converted to open procedure: Secondary | ICD-10-CM | POA: Diagnosis not present

## 2018-08-09 DIAGNOSIS — N939 Abnormal uterine and vaginal bleeding, unspecified: Secondary | ICD-10-CM | POA: Diagnosis present

## 2018-08-09 DIAGNOSIS — N840 Polyp of corpus uteri: Secondary | ICD-10-CM | POA: Diagnosis present

## 2018-08-09 DIAGNOSIS — Z888 Allergy status to other drugs, medicaments and biological substances status: Secondary | ICD-10-CM | POA: Diagnosis not present

## 2018-08-09 DIAGNOSIS — N83202 Unspecified ovarian cyst, left side: Secondary | ICD-10-CM | POA: Diagnosis present

## 2018-08-09 DIAGNOSIS — Z86711 Personal history of pulmonary embolism: Secondary | ICD-10-CM | POA: Diagnosis not present

## 2018-08-09 DIAGNOSIS — R971 Elevated cancer antigen 125 [CA 125]: Secondary | ICD-10-CM | POA: Diagnosis present

## 2018-08-09 DIAGNOSIS — K660 Peritoneal adhesions (postprocedural) (postinfection): Secondary | ICD-10-CM | POA: Diagnosis present

## 2018-08-09 DIAGNOSIS — N801 Endometriosis of ovary: Secondary | ICD-10-CM | POA: Diagnosis present

## 2018-08-09 DIAGNOSIS — Z880 Allergy status to penicillin: Secondary | ICD-10-CM | POA: Diagnosis not present

## 2018-08-09 DIAGNOSIS — N805 Endometriosis of intestine: Secondary | ICD-10-CM | POA: Diagnosis present

## 2018-08-09 DIAGNOSIS — Z87891 Personal history of nicotine dependence: Secondary | ICD-10-CM | POA: Diagnosis not present

## 2018-08-09 DIAGNOSIS — J449 Chronic obstructive pulmonary disease, unspecified: Secondary | ICD-10-CM | POA: Diagnosis present

## 2018-08-09 DIAGNOSIS — N852 Hypertrophy of uterus: Secondary | ICD-10-CM | POA: Diagnosis present

## 2018-08-09 LAB — CBC
HCT: 32.1 % — ABNORMAL LOW (ref 36.0–46.0)
Hemoglobin: 10.5 g/dL — ABNORMAL LOW (ref 12.0–15.0)
MCH: 31 pg (ref 26.0–34.0)
MCHC: 32.7 g/dL (ref 30.0–36.0)
MCV: 94.7 fL (ref 80.0–100.0)
Platelets: 194 10*3/uL (ref 150–400)
RBC: 3.39 MIL/uL — ABNORMAL LOW (ref 3.87–5.11)
RDW: 12.1 % (ref 11.5–15.5)
WBC: 7.8 10*3/uL (ref 4.0–10.5)
nRBC: 0 % (ref 0.0–0.2)

## 2018-08-09 LAB — BASIC METABOLIC PANEL
Anion gap: 5 (ref 5–15)
BUN: 6 mg/dL (ref 6–20)
CO2: 28 mmol/L (ref 22–32)
Calcium: 8.4 mg/dL — ABNORMAL LOW (ref 8.9–10.3)
Chloride: 104 mmol/L (ref 98–111)
Creatinine, Ser: 0.71 mg/dL (ref 0.44–1.00)
GFR calc Af Amer: >60 mL/min (ref >60)
GFR calc non Af Amer: >60 mL/min (ref >60)
Glucose, Bld: 113 mg/dL — ABNORMAL HIGH (ref 70–99)
Potassium: 3.5 mmol/L (ref 3.5–5.1)
Sodium: 137 mmol/L (ref 135–145)

## 2018-08-09 MED ORDER — ACETAMINOPHEN 500 MG PO TABS
ORAL_TABLET | ORAL | Status: AC
  Filled 2018-08-09: qty 2

## 2018-08-09 MED ORDER — ENOXAPARIN SODIUM 40 MG/0.4ML ~~LOC~~ SOLN
SUBCUTANEOUS | Status: AC
  Filled 2018-08-09: qty 0.4

## 2018-08-09 MED ORDER — ENOXAPARIN SODIUM 40 MG/0.4ML ~~LOC~~ SOLN
40.0000 mg | SUBCUTANEOUS | Status: DC
  Administered 2018-08-09 – 2018-08-10 (×2): 40 mg via SUBCUTANEOUS
  Filled 2018-08-09 (×2): qty 0.4

## 2018-08-09 MED ORDER — OXYCODONE HCL 5 MG PO TABS
ORAL_TABLET | ORAL | Status: AC
  Filled 2018-08-09: qty 1

## 2018-08-09 MED ORDER — ENOXAPARIN SODIUM 40 MG/0.4ML ~~LOC~~ SOLN
40.0000 mg | SUBCUTANEOUS | Status: DC
  Filled 2018-08-09: qty 0.4

## 2018-08-09 NOTE — Progress Notes (Signed)
S: Pt reports feeling improved - pain minimal. Able to ambulate now with minimal dizziness. Voiding freely. Passing gas. Shoulder pain resolved.   O:  Vitals:   08/09/18 0900 08/09/18 1344  BP: 114/71 118/76  Pulse: 74 71  Resp:    Temp:  97.7 F (36.5 C)  SpO2: 100% 100%   Gen: NAD, A&O Pulm: NOWB CTAB CV: Reg rate Abd: soft, minimally distended, appropriately ttp Incisions c/d/i - no change in small area of strikethrough from yesterday GYN: No bleeding  A/P: 42yo POD#1 s/p DSC LSC with laparotomy TAH BS and cystoscopy for newly dx stage IV endometriosis. Exam benign - progressing appropriately.   # Postop - EBL 200/hgb 10.5, platelets fine, CBC in AM given lovenox - general diet - VF - pain c/w PO pain meds - encouraged ambulation/IS  # Mood d/o:  - Cont home meds  # H/o PE - Lovenox qd - SCDs   Arty Baumgartner MD [phone]

## 2018-08-09 NOTE — Brief Op Note (Signed)
08/08/2018  7:25 AM  PATIENT:  Andrea Mcguire  42 y.o. female  PRE-OPERATIVE DIAGNOSIS:  dysparuenia, AUB, thickened EM  POST-OPERATIVE DIAGNOSIS:  dysparuenia, AUB, thickened EM, Endometriosis  PROCEDURE:  Procedure(s) with comments: ATTEMPTED LAPAROSCOPIC ASSISTED VAGINAL HYSTERECTOMY WITH SALPINGECTOMY, mccalls (Bilateral) - need bed CYSTOSCOPY (N/A) HYSTERECTOMY ABDOMINAL WITH BILATERAL SALPINGECTOMY  SURGEON:  Surgeon(s) and Role:    * Prateek Knipple, Colin Benton, MD - Primary    * Arvella Nigh, MD - Assisting  PHYSICIAN ASSISTANT:   ASSISTANTS: none   ANESTHESIA:   general  EBL:  200 mL   BLOOD ADMINISTERED:none  DRAINS: Urinary Catheter (Foley)   LOCAL MEDICATIONS USED:  NONE  SPECIMEN:  Source of Specimen:  uterus with cervix and bilateral fallopian tubes, Left ovarian cyst  DISPOSITION OF SPECIMEN:  PATHOLOGY  COUNTS:  YES  TOURNIQUET:  * No tourniquets in log *  DICTATION: .Note written in EPIC  PLAN OF CARE: Admit to inpatient   PATIENT DISPOSITION:  PACU - hemodynamically stable.   Delay start of Pharmacological VTE agent (>24hrs) due to surgical blood loss or risk of bleeding: yes

## 2018-08-09 NOTE — Op Note (Signed)
PREOPERATIVE DIAGNOSES: 1. AUB, pelvic pain, suspect endometriosis, elevated CA-125  POSTOPERATIVE DIAGNOSES: 1. AUB, pelvic pain, endometriosis  PROCEDURE PERFORMED: Diagnostic laparoscopy with conversion to open procedure, total abdominal hysterectomy, bilateral salpingectomy, left ovarian cystectomy, excision of endometriotic implants, lysis of adhesions  SURGEON: Dr. Lucillie Garfinkel ASSISTANT: Arvella Nigh  ANESTHESIA: General   ESTIMATED BLOOD LOSS: 200cc.  Urine out and fluids - see anethesia record  COMPLICATIONS: Conversion to open procedure  TUBES: None.  DRAINS: Foley to gravity  PATHOLOGY: Uterus, cervix, bilateral fallopian tubes, ovarian cyst, were sent to pathology for review.  FINDINGS: On exam, under anesthesia, normal appearing vulva and vagina, a  Mildly enlarged uterus  Operative findings demonstrated a 8-10 week sized uterus. Normal fallopian tubes. Ovaries with endometriosis b/l. Left ovary with small cyst. Extensive adhesive disease and severe endometriosis - stage IV. Endometriosis was noted in the bilateral ovaries, the uterus, the bladder flap, the anterior pelvic wall, the pelvic side walls, and on the colon. Lastly, it was noted in the bilateral uterosacrals with complete obliteration of the posterior culdesac and attachment of colon  Procedure: A general anesthesia was induced and the patient was placed in the dirsal lithotomy position. The abdomen, perineum, and vagina were prepped and draped in the usual fashion. A foley catheter inserted into the bladder and attached to straight drainage. After the initial preparation, the procedure commenced at the vagina. With a speculum in place to visualize the cervix, the cervix was grasped and a jacobs tenaculum was placed within the uterine cavity for manipulation purposes being careful not to puncture the uterus. Attention was then turned to the abdomen.    Following infiltration with Marcaine, an infraumbilical  incision was made and the Veress needle was gently advanced taking care to feel for the typical sensation of penetrating the peritoneum. With CO2 infiltration, an opening pressure of 3 mmHg was noted, and following this, a pneumoperitoneum of 15 mmHg was created. A 11 mm trocar was then passed through the same incision and the laparoscope was then inserted through the trocar sleeve. Visualization of the peritoneal cavity was then obtained and a detailed inspection did not reveal any signs of complications from entry. However, it was notable for adhesive disease and severe endometriosis - stage IV. See above operative findings. Given extensive adhesions and locations of endo, decided to immediately convert to open procedure.  A pfannenstiel incision was made and carried down to the underlying fascia. Fascia was incised in the midline and extended bilaterally with mayo scissors. Underlying rectus muscles were separated from the fascia superiorly and inferiorly in the usual fashion. Peritoneum was entered sharply with hemostat and metz with good visualization of the bladder and bowel. Once inside the abdominal cavity, a self-retaining retractor was placed. The uterus was then identified and grasped with upward traction. The round ligaments on either side were identified and individually dissected and ligated with #0 Vicryl suture and divided. This allowed [place] to then create a bladder flap by both blunt and sharp dissection.  The fallopian tube and ovarian ligament were isolated through the broad ligament from the uterine body and ligated and then fixated with 0 vicryl and tied with a free tie as well.  L ovary cystectomy performed sharply and with minimal bleeding - ~1cm cyst.  We then skeletonized the uterine vessels on either side and carefully dissected the bladder flap anteriorly. Posteriorly, the peritoneum was dissected down toward the uterosacral ligaments. Heaney clamps were then placed at each isthmic  portion of the  cervical body junction where the uterine arteries adjoined the uterus. These were clamped, ligated and divided using #0 Vicryl suture. The remainder of the uterus was then removed by the clamp-cut-ligation technique using #0 Vicryl on all major pedicles. With removal of the uterus, the vaginal cuff was closed with two haney stitches and then with serial figure of either stiches using #0 vicryl. Hemostasis was then inspected and secured throughout the entire area including the vaginal cuff, all pedicles, and the bladder. The lap sponges were then removed and the self-retaining retractor was removed. The patient tolerated the operation nicely. There were no complications associated with this surgical procedure to this point. The sponge count was correct times 2 at this time. The Foley catheter was inspected and clear urine was noted. Having removed all instruments and packs, we then began closure of the abdomen. The peritoneum was closed with #2-0 Vicryl in a running continuous manner. The fascia was closed with #0 Vicryl in a running continuous manner and the subcutaneous tissue was also closed with #2-0 Vicryl in a running continuous manner. Skin was closed with 4;0 vicryl. Umbilical port fascia was closed with 0' vicryl and skin with 4'0 vicryl.  Hemostasis was secured throughout the entire layers. The incisions were then closed as noted in the above operative findings.  Routine cystoscopy was performed and no bladder injuries found. Bilateral brisk ureteral jets noted.  The patient tolerated the operation nicely and was then taken to the Recovery Room in good condition.    Lucillie Garfinkel MD

## 2018-08-09 NOTE — Progress Notes (Signed)
S: Pain c/w PO pain meds. Occ R shoulder pain s/s leftover Co2- no overt chest pain, dyspnea, or SOB.   O: AFVSS Gen: NAD, A&O Pulm: NOWB CTAB CV: Reg rate Abd: soft, minimally distended, appropriately ttp Incisions c/d/i - no change in small area of strikethrough from yesterday GYN: min bleeding  A/P: 42yo POD#1 s/p DSC LSC with laparotomy TAH BS and cystoscopy for newly dx stage IV endometriosis. Exam benign - progressing appropriately.   # Postop - EBL 200/hgb 10.5, platelets fine - general diet - foley out, await trial of void - pain c/w PO pain meds - encouraged ambulation/IS  # Mood d/o:  - Cont home meds  # H/o PE - Lovenox qd - SCDs   Arty Baumgartner MD [phone]

## 2018-08-10 LAB — CBC
HCT: 30.9 % — ABNORMAL LOW (ref 36.0–46.0)
Hemoglobin: 10.2 g/dL — ABNORMAL LOW (ref 12.0–15.0)
MCH: 31.6 pg (ref 26.0–34.0)
MCHC: 33 g/dL (ref 30.0–36.0)
MCV: 95.7 fL (ref 80.0–100.0)
Platelets: 188 10*3/uL (ref 150–400)
RBC: 3.23 MIL/uL — ABNORMAL LOW (ref 3.87–5.11)
RDW: 12.3 % (ref 11.5–15.5)
WBC: 6.3 10*3/uL (ref 4.0–10.5)
nRBC: 0 % (ref 0.0–0.2)

## 2018-08-10 MED ORDER — DOCUSATE SODIUM 100 MG PO CAPS: 100.0000 mg | ORAL_CAPSULE | Freq: Two times a day (BID) | ORAL | 2 refills | Status: AC

## 2018-08-10 MED ORDER — TRAMADOL HCL 50 MG PO TABS: 50.0000 mg | ORAL_TABLET | Freq: Four times a day (QID) | ORAL | 0 refills | Status: AC | PRN

## 2018-08-10 MED ORDER — FERROUS SULFATE 325 (65 FE) MG PO TBEC: 325.0000 mg | DELAYED_RELEASE_TABLET | Freq: Two times a day (BID) | ORAL | 2 refills | Status: AC

## 2018-08-10 MED ORDER — OXYCODONE HCL 5 MG PO TABS: 5.0000 mg | ORAL_TABLET | ORAL | 0 refills | Status: AC | PRN

## 2018-08-10 NOTE — Progress Notes (Signed)
Discharge instructions given to pt and all questions were answered. Pt taken down via wheelchair and was picked up by her husband.  

## 2018-08-10 NOTE — Discharge Summary (Signed)
Physician Discharge Summary  Patient ID: Andrea Mcguire MRN: [id number] DOB/AGE: [birth year] 42 y.o.  Admit date: 08/08/2018 Discharge date: 08/10/2018  Admission Diagnoses:  Discharge Diagnoses:  Active Problems:   S/P hysterectomy   Discharged Condition: good  Hospital Course: Pt is a 42 yo admitted for scheduled above surgery. She had a [place] LSC with conversion to TAH BS excision of endometriotic implants, ovarian cystectomy, LOA and cysto s/s stage IV endometriosis. There was an EBL of 200cc. She had an uncomplicated postoperative course and on POD#2 was meeting all goals such as ambulating, passing flatus, voiding freely, and tolerating a general diet and she was deemed stable for discharge home. She has a h/o PE after major surgery and is on lovenox x 10 days postop. She understands importance of ambulation.    Consults: None  Significant Diagnostic Studies: labs:  CBC    Component Value Date/Time   WBC 6.3 08/10/2018 0516   RBC 3.23 (L) 08/10/2018 0516   HGB 10.2 (L) 08/10/2018 0516   HCT 30.9 (L) 08/10/2018 0516   PLT 188 08/10/2018 0516   MCV 95.7 08/10/2018 0516   MCH 31.6 08/10/2018 0516   MCHC 33.0 08/10/2018 0516   RDW 12.3 08/10/2018 0516   LYMPHSABS 2,263 06/17/2018 1625   MONOABS 312 08/14/2016 0911   EOSABS 161 06/17/2018 1625   BASOSABS 73 06/17/2018 1625     Treatments: surgery: TAH BS excision of endometriotic implants, ovarian cystectomy, LOA and cysto s/s stage IV endometriosis  Discharge Exam: Blood pressure 113/72, pulse 70, temperature 98 F (36.7 C), temperature source Oral, resp. rate 16, height 5\' 4"  (1.626 m), weight 66.9 kg, last menstrual period 07/25/2018, SpO2 98 %. General appearance: alert, cooperative and appears stated age Resp: clear to auscultation bilaterally Chest wall: no tenderness Cardio: regular rate and rhythm, S1, S2 normal, no murmur, click, rub or gallop GI: soft, non-tender; bowel sounds normal; no masses,  no  organomegaly Pelvic: vagina normal without discharge and no bleeding Extremities: extremities normal, atraumatic, no cyanosis or edema, Homans sign is negative, no sign of DVT and no edema, redness or tenderness in the calves or thighs Incision/Wound: c/d/i, strike-through stable  Disposition: Discharge disposition: 01-Home or Self Care       Discharge Instructions    Call MD for:   Complete by:  As directed    Heavy vaginal bleeding or abnormal vaginal discharge   Call MD for:  difficulty breathing, headache or visual disturbances   Complete by:  As directed    Call MD for:  persistant nausea and vomiting   Complete by:  As directed    Call MD for:  redness, tenderness, or signs of infection (pain, swelling, redness, odor or green/yellow discharge around incision site)   Complete by:  As directed    Call MD for:  severe uncontrolled pain   Complete by:  As directed    Call MD for:  temperature >100.4   Complete by:  As directed    Diet general   Complete by:  As directed    Driving Restrictions   Complete by:  As directed    Do not drive until you are off narcotic pain medications and you feel like you can react in an emergency.   Increase activity slowly   Complete by:  As directed    Leave dressing on - Keep it clean, dry, and intact until clinic visit   Complete by:  As directed    Lifting restrictions  Complete by:  As directed    Don't lift anything more than 15-20 pounds   Sexual Activity Restrictions   Complete by:  As directed    Nothing in the vagina x 2 to 6 weeks. We will discuss at clinic visit.     Allergies as of 08/10/2018      Reactions   Penicillins Hives   Has patient had a PCN reaction causing immediate rash, facial/tongue/throat swelling, SOB or lightheadedness with hypotension: unkn Has patient had a PCN reaction causing severe rash involving mucus membranes or skin necrosis: unkn Has patient had a PCN reaction that required hospitalization:  unkn Has patient had a PCN reaction occurring within the last 10 years: no If all of the above answers are "NO", then may proceed with Cephalosporin use.   Trazodone And Nefazodone    Worst dreams.   Trintellix [vortioxetine]    Rash, itching,increased anxiety   Wellbutrin [bupropion]    More anxiety and felt crazy.       Medication List    TAKE these medications   Albuterol Sulfate 108 (90 Base) MCG/ACT Aepb Inhale 2 puffs into the lungs every 4 (four) hours as needed.   cetirizine 5 MG tablet Commonly known as:  ZYRTEC Take 5 mg by mouth daily.   clonazePAM 1 MG tablet Commonly known as:  KLONOPIN Take 1 mg by mouth at bedtime.   docusate sodium 100 MG capsule Commonly known as:  COLACE Take 1 capsule (100 mg total) by mouth 2 (two) times daily.   enoxaparin 40 MG/0.4ML injection Commonly known as:  LOVENOX Inject 0.4 mLs (40 mg total) into the skin daily.   ferrous sulfate 325 (65 FE) MG EC tablet Take 1 tablet (325 mg total) by mouth 2 (two) times daily.   lamoTRIgine 150 MG tablet Commonly known as:  LAMICTAL Take 150 mg by mouth at bedtime.   oxyCODONE 5 MG immediate release tablet Commonly known as:  Oxy IR/ROXICODONE Take 1 tablet (5 mg total) by mouth every 3 (three) hours as needed for severe pain.   sertraline 25 MG tablet Commonly known as:  ZOLOFT Take 25 mg by mouth daily.   traMADol 50 MG tablet Commonly known as:  ULTRAM Take 1 tablet (50 mg total) by mouth every 6 (six) hours as needed for up to 7 days for moderate pain.        Signed: Tyson Dense 08/10/2018, 7:38 AM

## 2018-08-20 ENCOUNTER — Encounter: Payer: Self-pay | Admitting: Physician Assistant

## 2018-08-20 ENCOUNTER — Ambulatory Visit (INDEPENDENT_AMBULATORY_CARE_PROVIDER_SITE_OTHER): Payer: BLUE CROSS/BLUE SHIELD

## 2018-08-20 ENCOUNTER — Ambulatory Visit (INDEPENDENT_AMBULATORY_CARE_PROVIDER_SITE_OTHER): Payer: BLUE CROSS/BLUE SHIELD | Admitting: Physician Assistant

## 2018-08-20 VITALS — BP 121/85 | HR 78 | Temp 98.2°F | Ht 64.0 in | Wt 148.0 lb

## 2018-08-20 DIAGNOSIS — K769 Liver disease, unspecified: Secondary | ICD-10-CM

## 2018-08-20 DIAGNOSIS — R1030 Lower abdominal pain, unspecified: Secondary | ICD-10-CM

## 2018-08-20 DIAGNOSIS — K921 Melena: Secondary | ICD-10-CM

## 2018-08-20 DIAGNOSIS — Z90710 Acquired absence of both cervix and uterus: Secondary | ICD-10-CM

## 2018-08-20 DIAGNOSIS — T8149XA Infection following a procedure, other surgical site, initial encounter: Secondary | ICD-10-CM

## 2018-08-20 DIAGNOSIS — N809 Endometriosis, unspecified: Secondary | ICD-10-CM

## 2018-08-20 DIAGNOSIS — T8143XA Infection following a procedure, organ and space surgical site, initial encounter: Secondary | ICD-10-CM

## 2018-08-20 MED ORDER — METRONIDAZOLE 500 MG PO TABS: 500.0000 mg | ORAL_TABLET | Freq: Three times a day (TID) | ORAL | 0 refills | Status: DC

## 2018-08-20 MED ORDER — CEFIXIME 400 MG PO CAPS: 400.0000 mg | ORAL_CAPSULE | Freq: Every day | ORAL | 0 refills | Status: DC

## 2018-08-20 MED ORDER — IOPAMIDOL (ISOVUE-300) INJECTION 61%
100.0000 mL | Freq: Once | INTRAVENOUS | Status: AC | PRN
  Administered 2018-08-20: 100 mL via INTRAVENOUS

## 2018-08-20 NOTE — Telephone Encounter (Signed)
Call pt: she can follow up with me today if she would like. I don't mind making sure it is blood in stool and doing an exam and some blood work.

## 2018-08-20 NOTE — Patient Instructions (Signed)
Will get CT scan today.

## 2018-08-20 NOTE — Progress Notes (Signed)
Pt was called with results and abx sent to pharmacy. Follow up with surgeon in next 10 days.

## 2018-08-20 NOTE — Progress Notes (Addendum)
Subjective:    Patient ID: Andrea Mcguire, female    DOB: 1976-06-30, 42 y.o.   MRN: [id number]  HPI  Pt is a 42 yo female status post hysterectomy on 10/31 who presents to the clinic with persistent black tarry stools and left lower abdominal pain since about 8 days after surgery. Her ovaries are were left. Per surgeon she was told she had "signifcant stage 4 endometosis" and that they had to do a lot of scraping around uterus and colon. She followed up with surgeon and told to stop iron pill to see if helped. Did not help. She is having a lot of soreness in lower abdomen. Left side is worse than right. Last night she ate and had very intense burning in her abdomen. She has some nausea. She continues to take pain medication after surgery. She called Psychologist, sport and exercise but was out of town. Her surgeon had mentioned "internal bleeding" possibility and pt was concerned. She continues to stool with some straining. She is on lovenox at Day 13.   .. Active Ambulatory Problems    Diagnosis Date Noted  . Other pulmonary embolism and infarction 10/29/2008  . ACUTE MAXILLARY SINUSITIS 12/21/2008  . OTHER ACUTE SINUSITIS 09/24/2008  . ALLERGIC RHINITIS CAUSE UNSPECIFIED 07/27/2009  . PNEUMONIA, ORGANISM UNSPECIFIED 09/16/2008  . PLEURISY 11/04/2008  . FATIGUE 12/08/2008  . PARESTHESIA 12/08/2008  . DYSPNEA 09/16/2008  . VIRAL URI 11/02/2010  . DEPRESSION, MILD 12/19/2010  . Weight gain due to medication 05/20/2014  . History of pulmonary embolus (PE) 11/09/2014  . Anxiety state 11/09/2014  . MDD (major depressive disorder), recurrent episode, moderate ([place]) 07/26/2015  . Malaise and fatigue 07/26/2015  . PTSD (post-traumatic stress disorder) 07/26/2015  . Shingles 07/27/2015  . Insomnia 09/10/2015  . GAD (generalized anxiety disorder) 11/15/2015  . Mood changes 08/15/2016  . Elevated LDL cholesterol level 08/15/2016  . Hot flashes 08/17/2017  . Night sweats 08/17/2017  . Left lower quadrant pain  08/17/2017  . Elevated CA-125 08/01/2018  . Abnormal uterine bleeding (AUB) 08/01/2018  . S/P hysterectomy 08/08/2018  . Black tarry stools 08/20/2018  . Endometriosis 08/20/2018  . Lower abdominal pain 08/20/2018  . Postprocedural intraabdominal abscess 08/21/2018   Resolved Ambulatory Problems    Diagnosis Date Noted  . Anxiety state, unspecified 06/04/2013   Past Medical History:  Diagnosis Date  . Anxiety   . Asthma   . Dyspareunia, female   . Elevated cancer antigen 125 (CA 125)   . Herpes simplex virus (HSV) infection   . Pulmonary embolism ([place])   . Vaginal Pap smear, abnormal           Review of Systems See HPI.     Objective:   Physical Exam  Constitutional: She is oriented to person, place, and time.  Neck: Normal range of motion. Neck supple.  Cardiovascular: Normal rate and regular rhythm.  Pulmonary/Chest: Effort normal.  Abdominal:  Very tender abdomen with any palpation. Bruising noted throughout lower abdomen.    Genitourinary: Rectal exam shows guaiac negative stool.  Genitourinary Comments: Tenderness with DRE. No active bright red blood seen. Stool did have a dark appearance.   Neurological: She is alert and oriented to person, place, and time.  Skin:  No peripheral edema.   Psychiatric: She has a normal mood and affect. Her behavior is normal.          Assessment & Plan:  Marland KitchenMarland KitchenDiagnoses and all orders for this visit:  Status post hysterectomy -  CT Abdomen Pelvis W Contrast  Black tarry stools -     CT Abdomen Pelvis W Contrast  Endometriosis  Lower abdominal pain -     CT Abdomen Pelvis W Contrast  Postprocedural intraabdominal abscess -     cefixime (SUPRAX) 400 MG CAPS capsule; Take 1 capsule (400 mg total) by mouth daily. -     metroNIDAZOLE (FLAGYL) 500 MG tablet; Take 1 tablet (500 mg total) by mouth 3 (three) times daily.   hemoccult was negative which is reassuring.  POCT hgb was 12. Which is also reassuring of no  acute blood loss.   CT of abdomen to rule out colon perforation vs abscess vs internal bleeding.   IMPRESSION: 1. Postsurgical changes in the anterior abdominal with small fluid collections and air superficial to the rectus abdominus muscles, greater on the left. These could reflect small superficial abscesses. 2. No suspicious intra-abdominal fluid collections. Residual ovarian tissue bilaterally with right-sided follicle post hysterectomy. 3. Stable liver lesions, likely cysts and a hemangioma. 4. These results will be called to the ordering clinician or representative by the Radiology Department at the imaging location.  Reviewed results with supervising physician we are concerned with "small superficial abscesses" it also does correlate to left side where pain is greater for patient. Will treat for an intraabdominal infection with suprax and metronidazole. Follow up in with surgeon in the next week. No signs of internal bleeding or colon perforation.   Her black tarry stools could still be some residual iron supplementation byproducts and small microvascular bleeding from lovenox. Her last day is tomorrow with lovenox.   Pt was called with results.   Marland Kitchen.Spent 30 minutes with patient and greater than 50 percent of visit spent counseling patient regarding treatment plan.

## 2018-08-21 ENCOUNTER — Encounter: Payer: Self-pay | Admitting: Physician Assistant

## 2018-08-21 DIAGNOSIS — R5383 Other fatigue: Secondary | ICD-10-CM | POA: Diagnosis not present

## 2018-08-21 DIAGNOSIS — R102 Pelvic and perineal pain: Secondary | ICD-10-CM | POA: Diagnosis not present

## 2018-08-21 DIAGNOSIS — R197 Diarrhea, unspecified: Secondary | ICD-10-CM | POA: Diagnosis not present

## 2018-08-21 DIAGNOSIS — N939 Abnormal uterine and vaginal bleeding, unspecified: Secondary | ICD-10-CM | POA: Diagnosis not present

## 2018-08-21 DIAGNOSIS — N800 Endometriosis of uterus: Secondary | ICD-10-CM | POA: Diagnosis not present

## 2018-08-21 DIAGNOSIS — N736 Female pelvic peritoneal adhesions (postinfective): Secondary | ICD-10-CM | POA: Diagnosis not present

## 2018-08-21 DIAGNOSIS — N390 Urinary tract infection, site not specified: Secondary | ICD-10-CM | POA: Diagnosis not present

## 2018-08-21 DIAGNOSIS — R1084 Generalized abdominal pain: Secondary | ICD-10-CM | POA: Diagnosis not present

## 2018-08-21 DIAGNOSIS — T8149XA Infection following a procedure, other surgical site, initial encounter: Secondary | ICD-10-CM

## 2018-08-21 DIAGNOSIS — T8143XA Infection following a procedure, organ and space surgical site, initial encounter: Secondary | ICD-10-CM | POA: Insufficient documentation

## 2018-08-21 LAB — POCT HEMOGLOBIN: Hemoglobin: 12 g/dL (ref 9.5–13.5)

## 2018-08-26 DIAGNOSIS — K921 Melena: Secondary | ICD-10-CM | POA: Diagnosis not present

## 2018-08-26 DIAGNOSIS — R5383 Other fatigue: Secondary | ICD-10-CM | POA: Diagnosis not present

## 2018-08-26 DIAGNOSIS — R110 Nausea: Secondary | ICD-10-CM | POA: Diagnosis not present

## 2018-08-26 DIAGNOSIS — R42 Dizziness and giddiness: Secondary | ICD-10-CM | POA: Diagnosis not present

## 2018-08-26 DIAGNOSIS — R799 Abnormal finding of blood chemistry, unspecified: Secondary | ICD-10-CM | POA: Diagnosis not present

## 2018-09-09 DIAGNOSIS — R42 Dizziness and giddiness: Secondary | ICD-10-CM | POA: Diagnosis not present

## 2018-09-11 ENCOUNTER — Encounter: Payer: Self-pay | Admitting: Physician Assistant

## 2018-09-12 ENCOUNTER — Encounter: Payer: Self-pay | Admitting: Family Medicine

## 2018-09-12 ENCOUNTER — Ambulatory Visit (INDEPENDENT_AMBULATORY_CARE_PROVIDER_SITE_OTHER): Payer: BLUE CROSS/BLUE SHIELD | Admitting: Family Medicine

## 2018-09-12 ENCOUNTER — Ambulatory Visit (INDEPENDENT_AMBULATORY_CARE_PROVIDER_SITE_OTHER): Payer: BLUE CROSS/BLUE SHIELD

## 2018-09-12 VITALS — BP 118/71 | HR 71 | Temp 98.4°F | Ht 63.39 in | Wt 148.0 lb

## 2018-09-12 DIAGNOSIS — R1032 Left lower quadrant pain: Secondary | ICD-10-CM | POA: Diagnosis not present

## 2018-09-12 DIAGNOSIS — Z90710 Acquired absence of both cervix and uterus: Secondary | ICD-10-CM | POA: Diagnosis not present

## 2018-09-12 MED ORDER — IOPAMIDOL (ISOVUE-300) INJECTION 61%
100.0000 mL | Freq: Once | INTRAVENOUS | Status: AC | PRN
  Administered 2018-09-12: 100 mL via INTRAVENOUS

## 2018-09-12 NOTE — Telephone Encounter (Signed)
Pt called office- she is concerned that she is not feeling better and had HX abscess. Pt has been scheduled to see Dr Madilyn Fireman today.

## 2018-09-12 NOTE — Progress Notes (Signed)
Subjective:    Patient ID: Andrea Mcguire, female    DOB: [birth year], 42 y.o.   MRN: [id number]  HPI She had a hysterectomy on 08/08/18.  pt reports that she f/u w/her OB on monday and was advised to have a repeat CT. she continues to have pain on her L side. she said that it is a 7/10 it is burning, throbbing,shooting the area feels swollen. she feels dizzy when she stands up, has had headaches, denies f/s/c or vomiting, her BM's have been normal, she has used a heating pad, tylenol and motrin to help with her pain but said that her pain was so bad last night she had to take something stronger.  She had a CT on November 12 right after her surgery showing some postsurgical changes in the anterior abdomen with superficial air in the rectus abdominis muscles greater on the left.  They felt like that could reflect a small superficial abscess.  But no other suspicious intra-abdominal fluid collections.  She was treated with antibiotics. Using  Tylenol and ibuprofen for pain relief.  She said it was bad enough last night that she MS went to the emergency department.  Do not have her OB/GYN notes from her visit earlier this week but per her report they did do a vaginal exam and also did a urinalysis all of which were reassuring.   Review of Systems NO fever, chills.      BP 118/71   Pulse 71   Temp 98.4 F (36.9 C)   Ht 5' 3.39" (1.61 m)   Wt 148 lb (67.1 kg)   LMP 07/25/2018   SpO2 100%   BMI 25.90 kg/m     Allergies  Allergen Reactions  . Penicillins Hives    Has patient had a PCN reaction causing immediate rash, facial/tongue/throat swelling, SOB or lightheadedness with hypotension: unkn Has patient had a PCN reaction causing severe rash involving mucus membranes or skin necrosis: unkn Has patient had a PCN reaction that required hospitalization: unkn Has patient had a PCN reaction occurring within the last 10 years: no If all of the above answers are "NO", then may proceed with  Cephalosporin use.   . Trazodone And Nefazodone     Worst dreams.  Rennis Harding [Vortioxetine]     Rash, itching,increased anxiety  . Wellbutrin [Bupropion]     More anxiety and felt crazy.     Past Medical History:  Diagnosis Date  . Abnormal uterine bleeding (AUB)   . Anxiety   . Asthma    exercise induced   . Dyspareunia, female   . Elevated cancer antigen 125 (CA 125)   . Herpes simplex virus (HSV) infection   . Pulmonary embolism ([place])    over year ago, in setting of immobility. Testing reported to be neg by pt  . Vaginal Pap smear, abnormal     Past Surgical History:  Procedure Laterality Date  . COSMETIC SURGERY    . CYSTOSCOPY N/A 08/08/2018   Procedure: CYSTOSCOPY;  Surgeon: Tyson Dense, MD;  Location: [facility];  Service: Gynecology;  Laterality: N/A;  . HYSTERECTOMY ABDOMINAL WITH SALPINGECTOMY  08/08/2018   Procedure: HYSTERECTOMY ABDOMINAL WITH BILATERAL SALPINGECTOMY;  Surgeon: Tyson Dense, MD;  Location: [facility];  Service: Gynecology;;  . LAPAROSCOPIC VAGINAL HYSTERECTOMY WITH SALPINGECTOMY Bilateral 08/08/2018   Procedure: ATTEMPTED LAPAROSCOPIC ASSISTED VAGINAL HYSTERECTOMY WITH SALPINGECTOMY, [place];  Surgeon: Tyson Dense, MD;  Location: [facility];  Service:  Gynecology;  Laterality: Bilateral;  need bed  . nsvd     x 2  . tummy tuck      Social History   Socioeconomic History  . Marital status: Single    Spouse name: Not on file  . Number of children: Not on file  . Years of education: Not on file  . Highest education level: Not on file  Occupational History  . Not on file  Social Needs  . Financial resource strain: Not on file  . Food insecurity:    Worry: Not on file    Inability: Not on file  . Transportation needs:    Medical: Not on file    Non-medical: Not on file  Tobacco Use  . Smoking status: Former Smoker    Years: 12.00    Types: Cigarettes  .  Smokeless tobacco: Never Used  . Tobacco comment: unsure of quit date   Substance and Sexual Activity  . Alcohol use: Yes    Alcohol/week: 4.0 standard drinks    Types: 4 Glasses of wine per week  . Drug use: No  . Sexual activity: Not on file  Lifestyle  . Physical activity:    Days per week: Not on file    Minutes per session: Not on file  . Stress: Not on file  Relationships  . Social connections:    Talks on phone: Not on file    Gets together: Not on file    Attends religious service: Not on file    Active member of club or organization: Not on file    Attends meetings of clubs or organizations: Not on file    Relationship status: Not on file  . Intimate partner violence:    Fear of current or ex partner: Not on file    Emotionally abused: Not on file    Physically abused: Not on file    Forced sexual activity: Not on file  Other Topics Concern  . Not on file  Social History Narrative  . Not on file    Family History  Problem Relation Age of Onset  . Depression Mother   . Anxiety disorder Mother   . Depression Sister   . Anxiety disorder Sister     Outpatient Encounter Medications as of 09/12/2018  Medication Sig  . Albuterol Sulfate (PROAIR RESPICLICK) 601 (90 BASE) MCG/ACT AEPB Inhale 2 puffs into the lungs every 4 (four) hours as needed.  . cetirizine (ZYRTEC) 5 MG tablet Take 5 mg by mouth daily.  . clonazePAM (KLONOPIN) 1 MG tablet Take 1 mg by mouth at bedtime.   . docusate sodium (COLACE) 100 MG capsule Take 1 capsule (100 mg total) by mouth 2 (two) times daily.  . ferrous sulfate 325 (65 FE) MG EC tablet Take 1 tablet (325 mg total) by mouth 2 (two) times daily.  Marland Kitchen lamoTRIgine (LAMICTAL) 150 MG tablet Take 150 mg by mouth at bedtime.   Marland Kitchen oxyCODONE (OXY IR/ROXICODONE) 5 MG immediate release tablet Take 1 tablet (5 mg total) by mouth every 3 (three) hours as needed for severe pain.  Marland Kitchen sertraline (ZOLOFT) 25 MG tablet Take 25 mg by mouth daily.   .  [DISCONTINUED] cefixime (SUPRAX) 400 MG CAPS capsule Take 1 capsule (400 mg total) by mouth daily.  . [DISCONTINUED] enoxaparin (LOVENOX) 40 MG/0.4ML injection Inject 0.4 mLs (40 mg total) into the skin daily.  . [DISCONTINUED] metroNIDAZOLE (FLAGYL) 500 MG tablet Take 1 tablet (500 mg total) by mouth 3 (three)  times daily.   No facility-administered encounter medications on file as of 09/12/2018.       Objective:   Physical Exam  Constitutional: She is oriented to person, place, and time. She appears well-developed and well-nourished.  HENT:  Head: Normocephalic and atraumatic.  Cardiovascular: Normal rate, regular rhythm and normal heart sounds.  Pulmonary/Chest: Effort normal and breath sounds normal.  Abdominal: Soft. Bowel sounds are normal. She exhibits no distension and no mass. There is tenderness. There is no rebound and no guarding. No hernia.  Very tender in the LLQ.   Neurological: She is alert and oriented to person, place, and time.  Skin: Skin is warm and dry.  Psychiatric: She has a normal mood and affect. Her behavior is normal.   Surgical scar looks well-healed.  She still has a little bit of bruising over that left lower abdomen.     Assessment & Plan:  LLQ abd pain 5 weeks status post hysterectomy-unfortunately she is continued have discomfort and pain.  She had a possible abscess in the abdominal wall seen on CT about a week after her surgery and was treated with a round of antibiotics.  Concerning for persistent fluid collection and or infection.  We did call radiology to see if there were any alternative imaging modalities that could be used to reduce her risk for radiation exposure as she had already had 2 CT abdomen pelvis is within the last couple of months.  They recommended that it would be best to continue with a CT of the pelvis only with contrast to really better evaluate the area and that ultrasound would not be sufficient and MRI would not be recommended.   Sinew with Tylenol and ibuprofen for pain relief.  With results once available.

## 2018-09-13 ENCOUNTER — Ambulatory Visit: Payer: BLUE CROSS/BLUE SHIELD | Admitting: Physician Assistant

## 2018-09-13 MED ORDER — LEVOFLOXACIN 500 MG PO TABS: 500.0000 mg | ORAL_TABLET | Freq: Every day | ORAL | 0 refills | Status: AC

## 2019-11-01 IMAGING — CT CT ABD-PELV W/ CM
2 of 5 series · 15 of 46 positions shown, 17 images · IV contrast (iopamidol)
Comparison: None.

CLINICAL DATA: 42-year-old female with increasing bilateral lower
extremity pain, nausea, vomiting and diarrhea.

EXAM:
CT ABDOMEN AND PELVIS WITH CONTRAST
TECHNIQUE: Multidetector CT imaging of the abdomen and pelvis was performed
using the standard protocol following bolus administration of
intravenous contrast.
CONTRAST:  100mL DNXXXD-FVV IOPAMIDOL (DNXXXD-FVV) INJECTION 61%

[Series 2: axial st · axial · 0.92mm/px · z∈[-466,-42]mm · 12 of 97 slices shown, 14 images]
[im 6/97  soft-tissue]
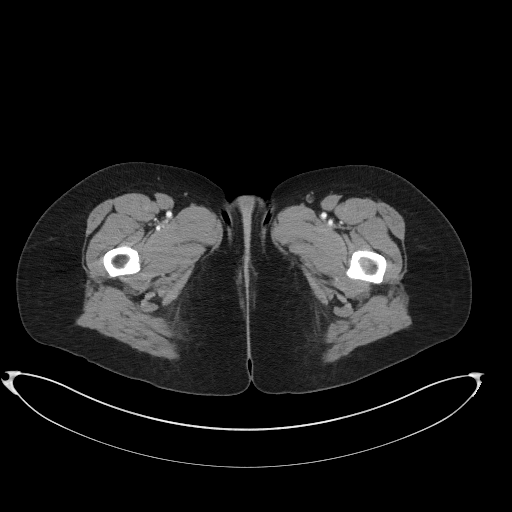
[im 6/97  bone]
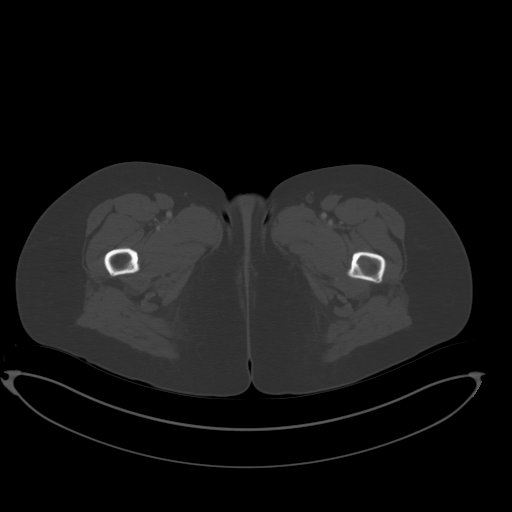
[im 16/97  soft-tissue]
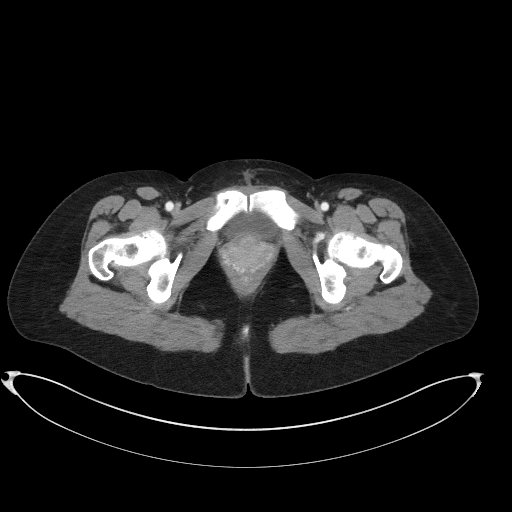
[im 21/97  soft-tissue]
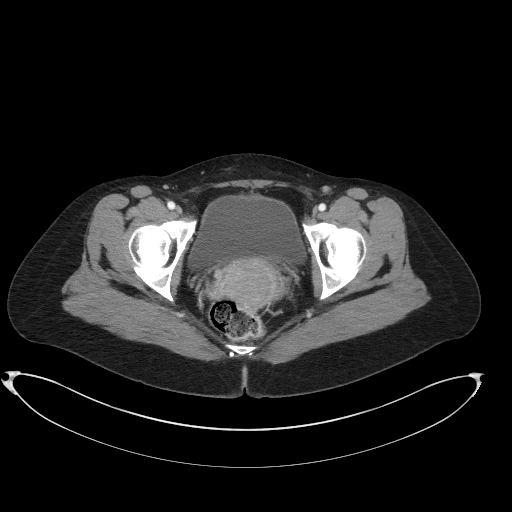
[im 31/97  soft-tissue]
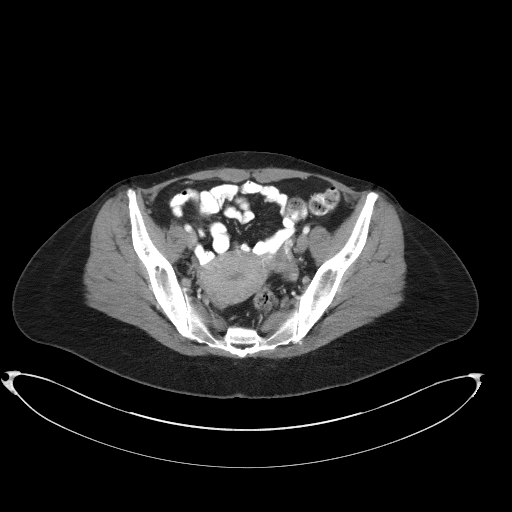
[im 36/97  soft-tissue]
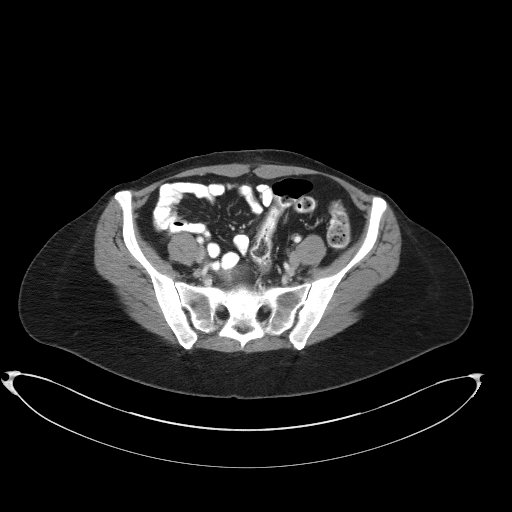
[im 46/97  soft-tissue]
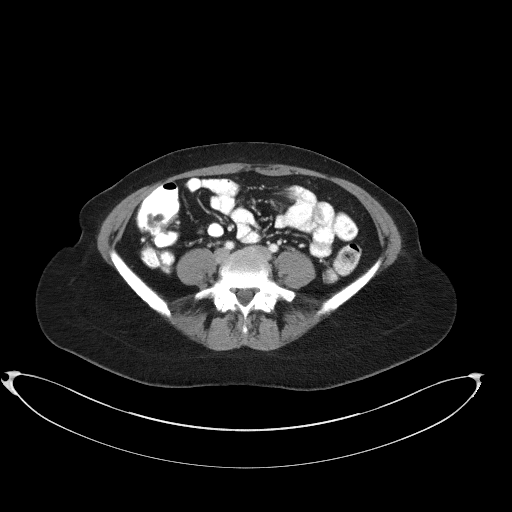
[im 51/97  soft-tissue]
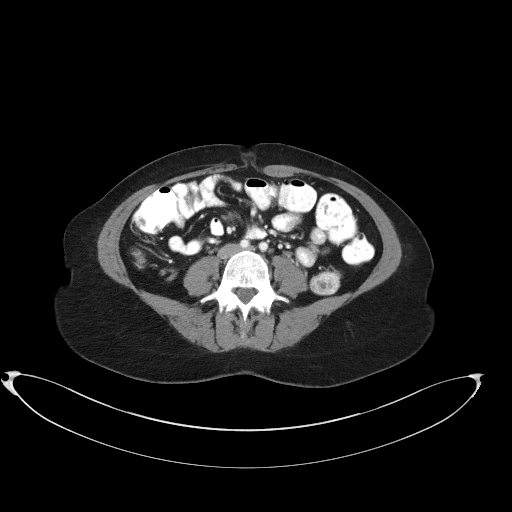
[im 61/97  soft-tissue]
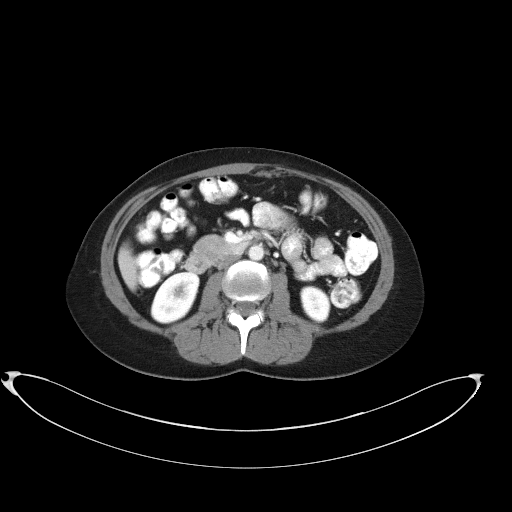
[im 66/97  soft-tissue]
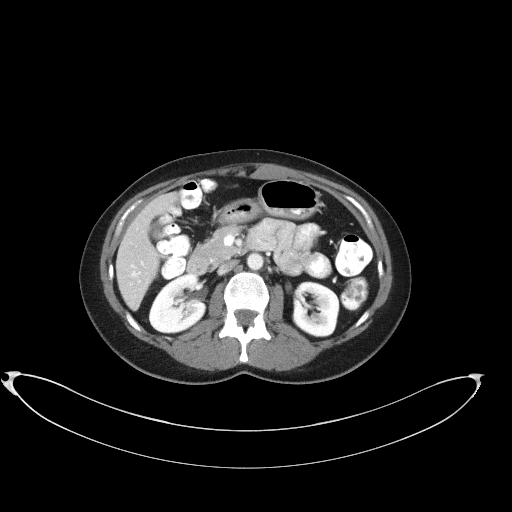
[im 66/97  bone]
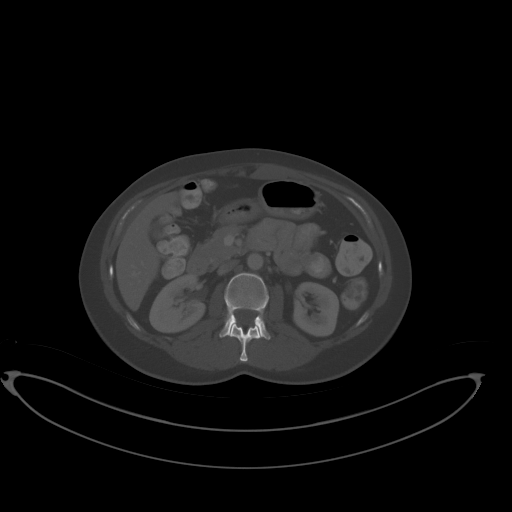
[im 76/97  soft-tissue]
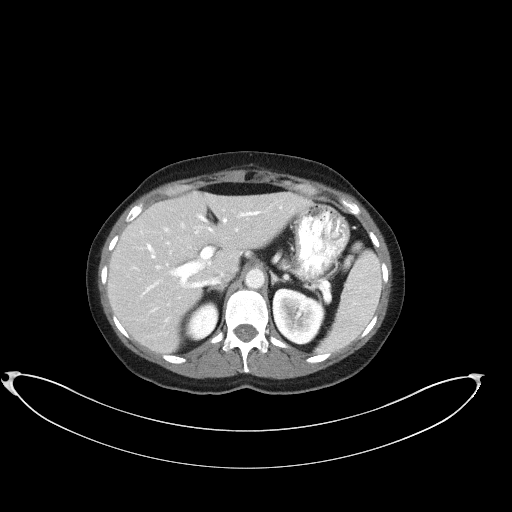
[im 81/97  soft-tissue]
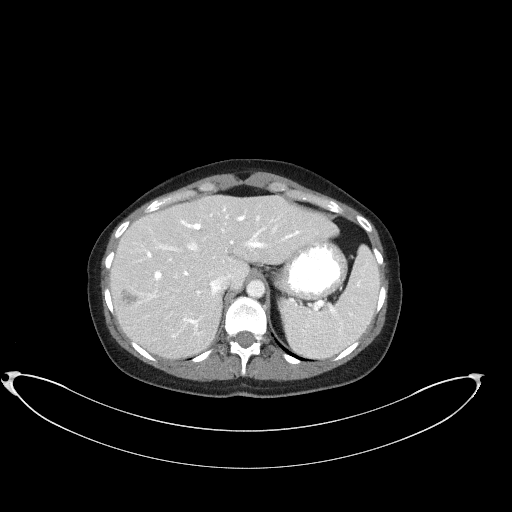
[im 91/97  soft-tissue]
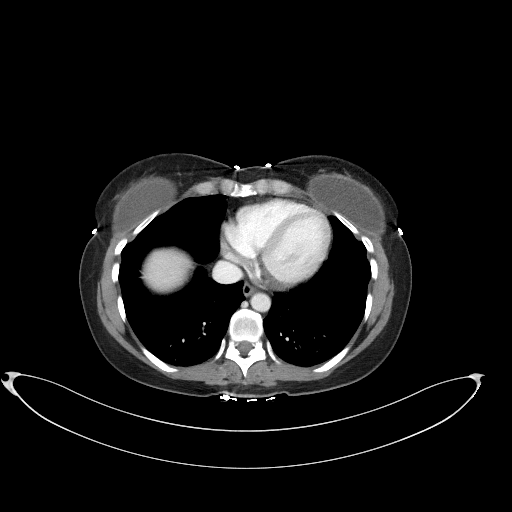

[Series 5: coronal st · coronal · 0.72mm/px · 3 of 78 slices shown]
[im 26/78  soft-tissue]
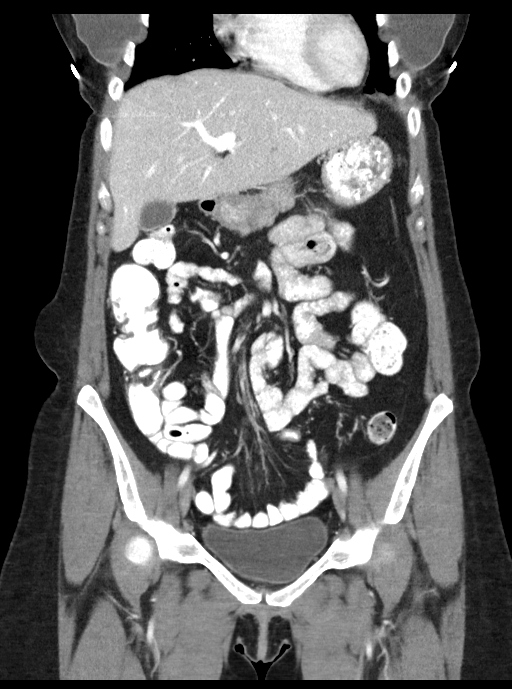
[im 35/78  soft-tissue]
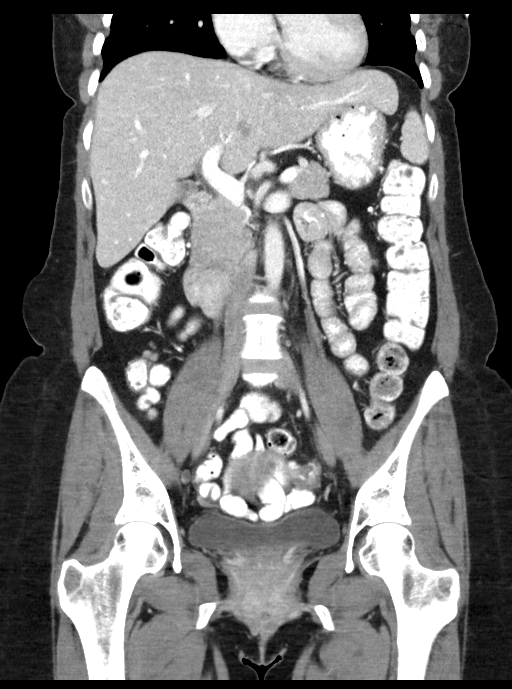
[im 43/78  soft-tissue]
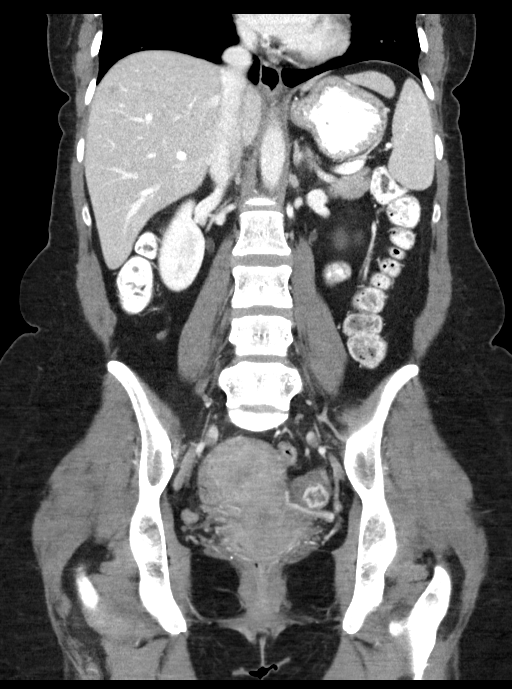

[15 of 46 positions shown; findings below may reference images not displayed]

FINDINGS: Lower chest: Incompletely imaged bilateral breast augmentation
prostheses. Trace dependent atelectasis in the lower lobes. The
visualized cardiac structures are normal in size. Unremarkable
distal thoracic aorta.

Hepatobiliary: Small circumscribed 5 mm low-attenuation lesion in
hepatic segment 8 is too small for accurate characterization but
statistically highly likely a benign cyst. A more ill-defined 1.7 cm
lesion, also in hepatic segment 8 demonstrates peripheral incomplete
nodular enhancement with fill-in on the delayed images most
consistent with a benign hemangioma. A third subcentimeter
low-attenuation lesion adjacent to the gallbladder is too small for
characterization and statistically a small cyst or hamartoma.
Gallbladder is unremarkable. No intra or extrahepatic biliary ductal
dilatation. Widely patent portal veins.

Pancreas: Unremarkable. No pancreatic ductal dilatation or
surrounding inflammatory changes.

Spleen: Normal in size without focal abnormality.

Adrenals/Urinary Tract: Adrenal glands are unremarkable. Kidneys are
normal, without renal calculi, focal lesion, or hydronephrosis.
Bladder is unremarkable.

Stomach/Bowel: Stomach is within normal limits. Appendix appears
normal. No evidence of bowel wall thickening, distention, or
inflammatory changes.

Vascular/Lymphatic: No significant vascular findings are present. No
enlarged abdominal or pelvic lymph nodes.

Reproductive: Status post hysterectomy. No adnexal masses.

Other: Small fat containing umbilical hernia. No evidence of
ascites.

Musculoskeletal: No acute fracture or aggressive appearing lytic or
blastic osseous lesion.
IMPRESSION: 1. No acute abnormality within the abdomen or pelvis.
2. Hepatic hemangioma and likely small hepatic cysts.
3. Small fat containing umbilical hernia.

## 2020-01-03 IMAGING — CT CT ABD-PELV W/ CM
2 of 5 series · 15 of 46 positions shown, 17 images · IV contrast (iopamidol)
Comparison: CT 06/18/2018.

CLINICAL DATA: Black tarry stools and nausea since hysterectomy and
salpingectomy 08/08/2018. Question abdominal infection. History of
endometriosis.

EXAM:
CT ABDOMEN AND PELVIS WITH CONTRAST
TECHNIQUE: Multidetector CT imaging of the abdomen and pelvis was performed
using the standard protocol following bolus administration of
intravenous contrast.
CONTRAST:  100mL [id number] IOPAMIDOL ([id number]) INJECTION 61%

[Series 2: axial st · axial · 0.92mm/px · z∈[-464,-64]mm · 12 of 94 slices shown, 14 images]
[im 7/94  soft-tissue]
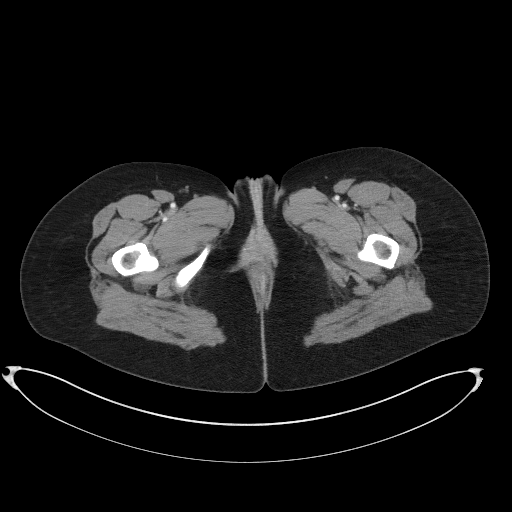
[im 7/94  bone]
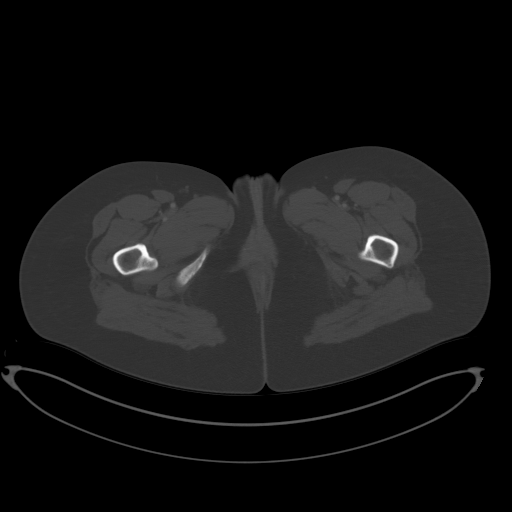
[im 14/94  soft-tissue]
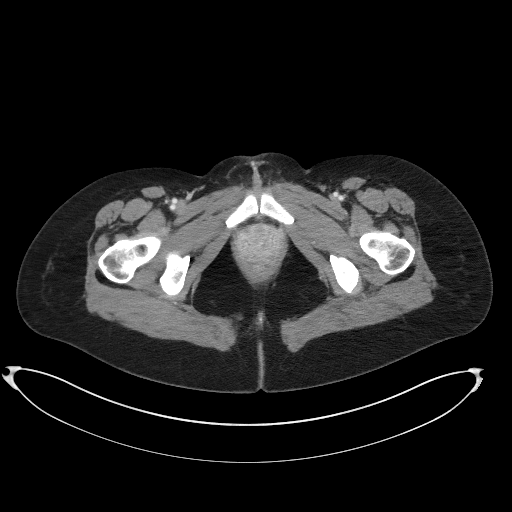
[im 20/94  soft-tissue]
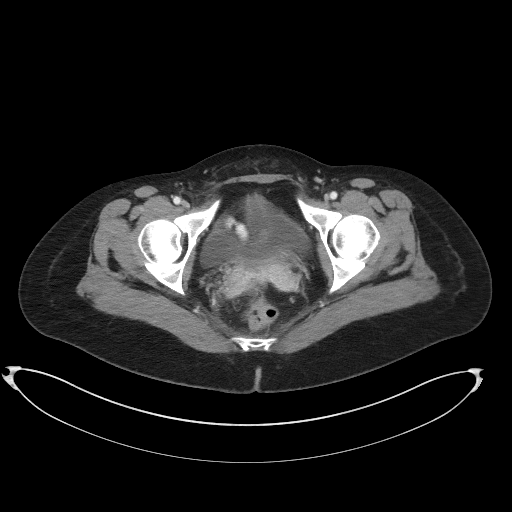
[im 27/94  soft-tissue]
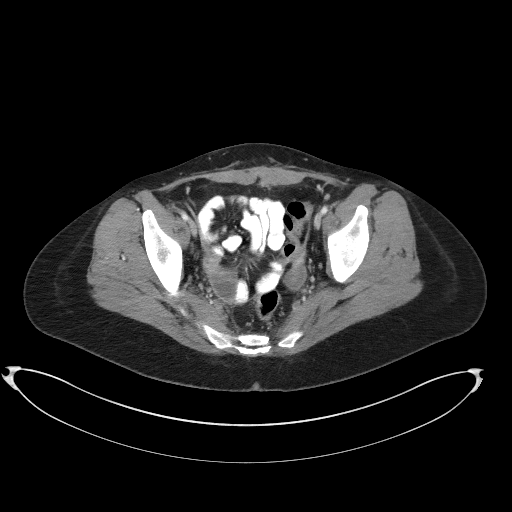
[im 34/94  soft-tissue]
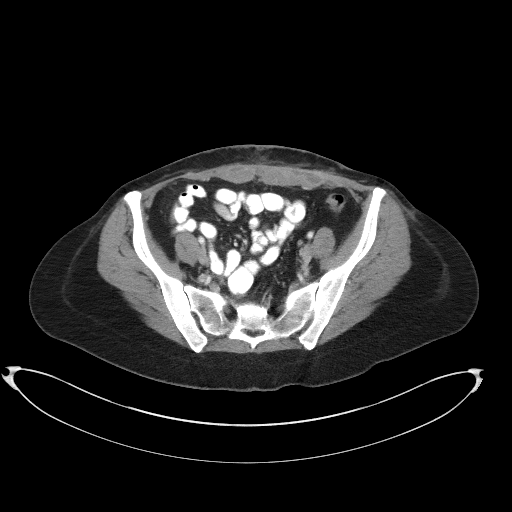
[im 40/94  soft-tissue]
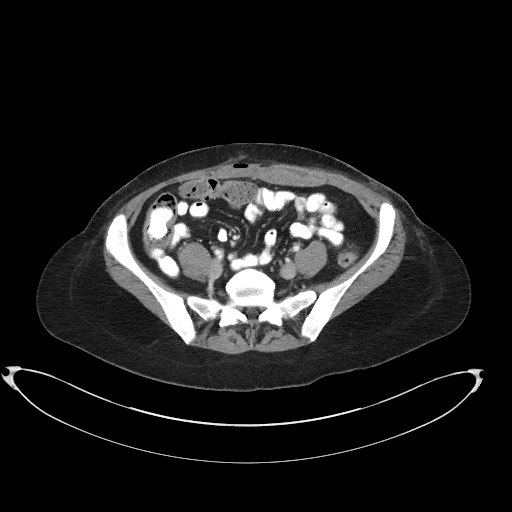
[im 54/94  soft-tissue]
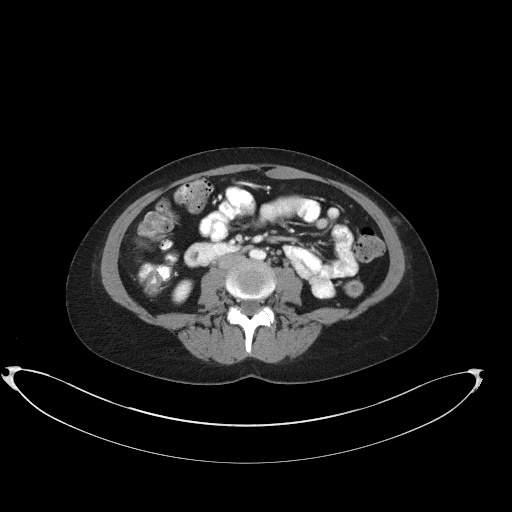
[im 60/94  soft-tissue]
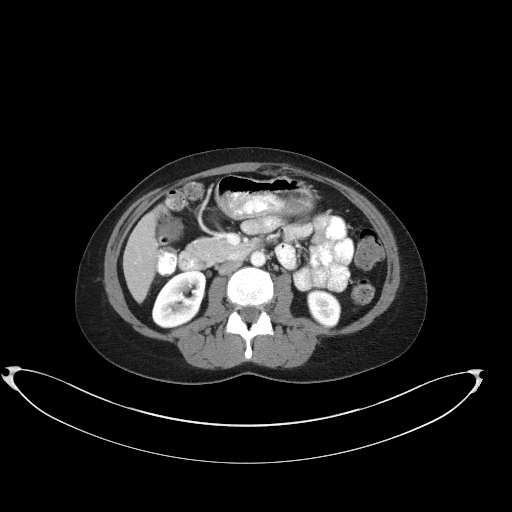
[im 67/94  soft-tissue]
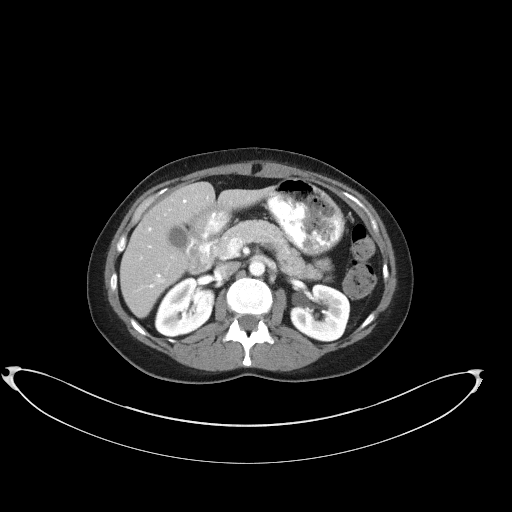
[im 67/94  bone]
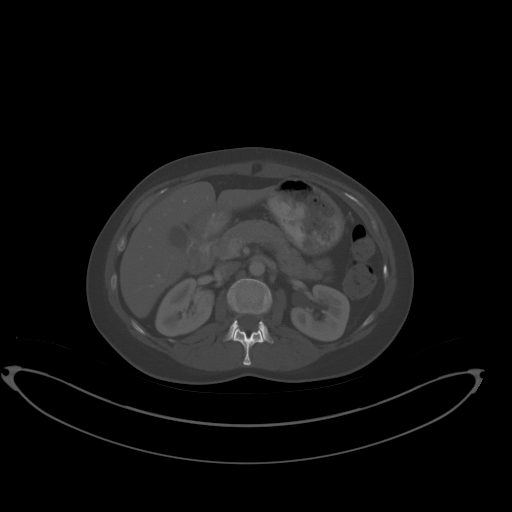
[im 74/94  soft-tissue]
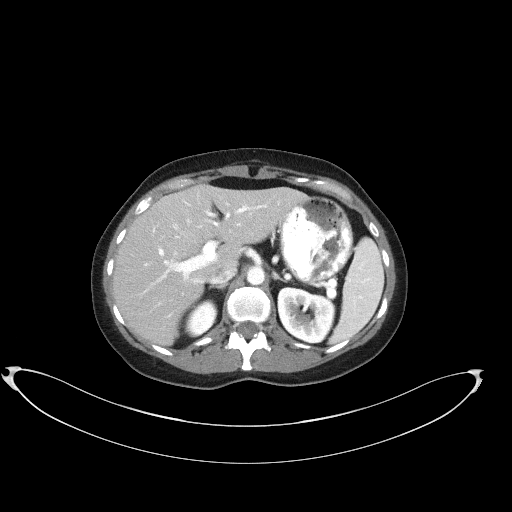
[im 80/94  soft-tissue]
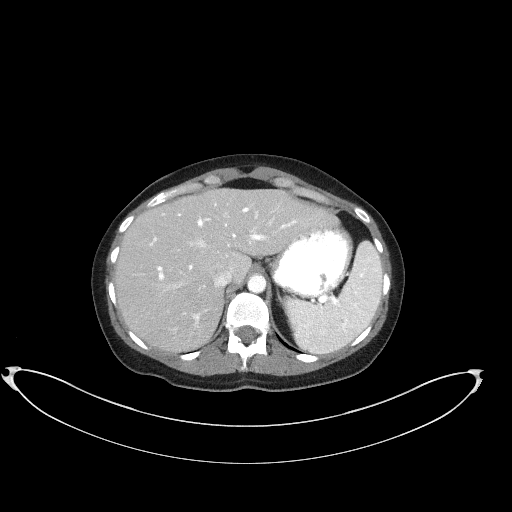
[im 87/94  soft-tissue]
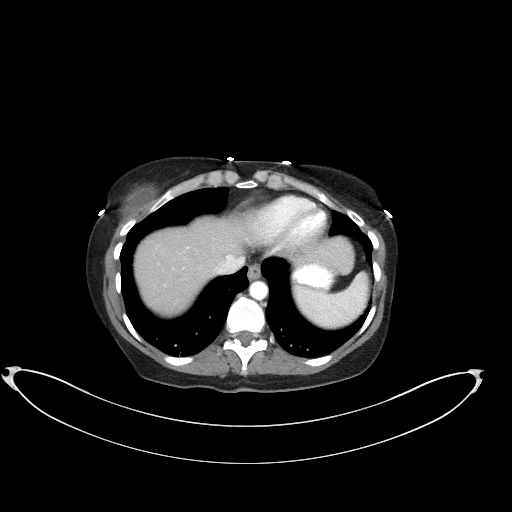

[Series 5: coronal st · coronal · 0.71mm/px · 3 of 80 slices shown]
[im 27/80  soft-tissue]
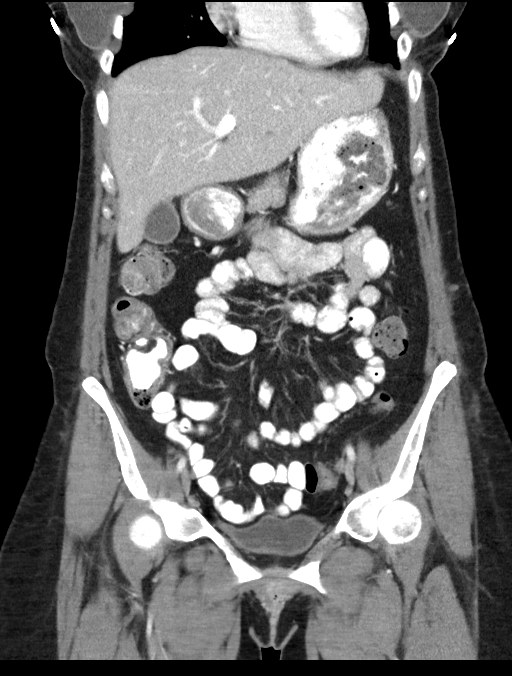
[im 36/80  soft-tissue]
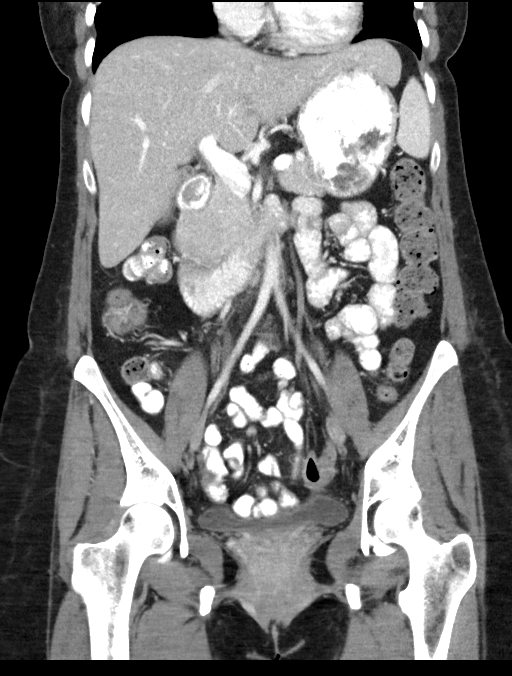
[im 44/80  soft-tissue]
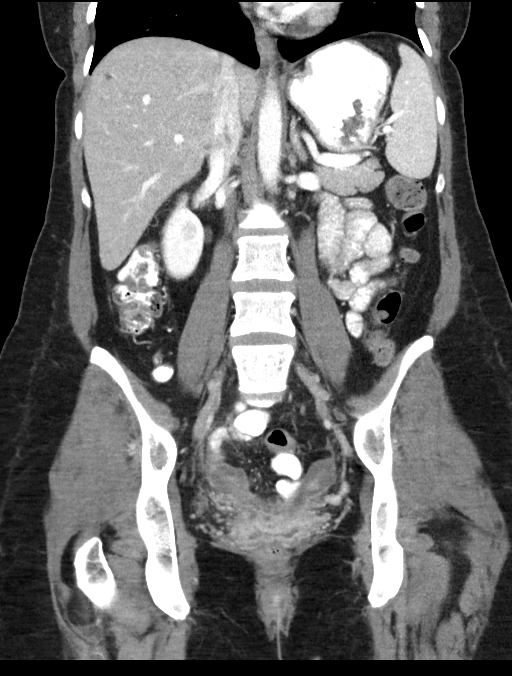

[15 of 46 positions shown; findings below may reference images not displayed]

FINDINGS: Lower chest: Minimal dependent atelectasis at both lung bases. No
significant pleural or pericardial effusion.

Hepatobiliary: There are stable low-density hepatic lesions,
including a 15 mm lesion in the right lobe on image [DATE], likely a
hemangioma. Other smaller lesions are likely cysts. No new or
enlarging hepatic lesions. No evidence of gallstones, gallbladder
wall thickening or biliary dilatation.

Pancreas: Unremarkable. No pancreatic ductal dilatation or
surrounding inflammatory changes.

Spleen: Normal in size without focal abnormality.

Adrenals/Urinary Tract: Both adrenal glands appear normal. The
kidneys appear normal without evidence of urinary tract calculus,
suspicious lesion or hydronephrosis. No bladder abnormalities are
seen.

Stomach/Bowel: No evidence of bowel wall thickening, distention or
surrounding inflammatory change. There is mildly prominent stool
throughout the colon. The appendix appears normal.

Vascular/Lymphatic: There are no enlarged abdominal or pelvic lymph
nodes. No significant vascular findings.

Reproductive: Interval hysterectomy. Soft tissue structures in both
adnexa are contiguous with ovarian veins and likely represent the
patient's ovaries. On the right side, there is an associated 2.1 cm
low-density component on image 68/2, probably an ovarian follicle.
The cervix appears normal.

Other: Postsurgical changes are present within the anterior
abdominal wall with soft tissue emphysema superficial to the rectus
abdominus muscles. There are small superficial fluid collections
just off midline to the left, measuring up to 8 mm transverse on
image 58/2 and extending 2.8 cm cephalocaudad (image [DATE]). No
intra-abdominal fluid collections or free intraperitoneal air.

Musculoskeletal: No acute or significant osseous findings. Bilateral
breast implants are noted.
IMPRESSION: 1. Postsurgical changes in the anterior abdominal with small fluid
collections and air superficial to the rectus abdominus muscles,
greater on the left. These could reflect small superficial
abscesses.
2. No suspicious intra-abdominal fluid collections. Residual ovarian
tissue bilaterally with right-sided follicle post hysterectomy.
3. Stable liver lesions, likely cysts and a hemangioma.
4. These results will be called to the ordering clinician or
representative by the [HOSPITAL] at the imaging location.
